# Patient Record
Sex: Male | Born: 1995 | Race: White | Hispanic: No | Marital: Single | Smoking: Former smoker
Health system: Southern US, Community
[De-identification: ages and names within clinical notes are randomized; demographics above are authoritative.]

## PROBLEM LIST (undated history)

## (undated) DIAGNOSIS — M419 Scoliosis, unspecified: Secondary | ICD-10-CM

## (undated) HISTORY — DX: Scoliosis, unspecified: M41.9

---
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
## 2015-02-25 ENCOUNTER — Emergency Department (HOSPITAL_COMMUNITY)
Admission: EM | Admit: 2015-02-25 | Discharge: 2015-02-25 | Disposition: A | Discharge status: Home / Self Care | Payer: Self-pay | Attending: Emergency Medicine | Admitting: Emergency Medicine

## 2015-02-25 ENCOUNTER — Encounter (HOSPITAL_COMMUNITY): Payer: Self-pay | Admitting: *Deleted

## 2015-02-25 ENCOUNTER — Emergency Department (HOSPITAL_COMMUNITY): Payer: Self-pay

## 2015-02-25 DIAGNOSIS — S60032A Contusion of left middle finger without damage to nail, initial encounter: Secondary | ICD-10-CM | POA: Insufficient documentation

## 2015-02-25 DIAGNOSIS — Z720 Tobacco use: Secondary | ICD-10-CM | POA: Insufficient documentation

## 2015-02-25 DIAGNOSIS — Y9289 Other specified places as the place of occurrence of the external cause: Secondary | ICD-10-CM | POA: Insufficient documentation

## 2015-02-25 DIAGNOSIS — Y990 Civilian activity done for income or pay: Secondary | ICD-10-CM | POA: Insufficient documentation

## 2015-02-25 DIAGNOSIS — Z79899 Other long term (current) drug therapy: Secondary | ICD-10-CM | POA: Insufficient documentation

## 2015-02-25 DIAGNOSIS — W208XXA Other cause of strike by thrown, projected or falling object, initial encounter: Secondary | ICD-10-CM | POA: Insufficient documentation

## 2015-02-25 DIAGNOSIS — S63613A Unspecified sprain of left middle finger, initial encounter: Secondary | ICD-10-CM | POA: Insufficient documentation

## 2015-02-25 DIAGNOSIS — Y9389 Activity, other specified: Secondary | ICD-10-CM | POA: Insufficient documentation

## 2015-02-25 DIAGNOSIS — S63619A Unspecified sprain of unspecified finger, initial encounter: Secondary | ICD-10-CM

## 2015-02-25 NOTE — ED Notes (Signed)
Pt reports injuring left middle finger at work on wed, reports a tire fell on his finger.

## 2015-02-25 NOTE — ED Provider Notes (Signed)
CSN: [id number]     Arrival date & time 02/25/15  0911 History  This chart was scribed for Burna FortsJeff Toni Demo, PA-C, working with Bethann BerkshireJoseph Zammit, MD by Elon SpannerGarrett Cook, ED Scribe. This patient was seen in room TR10C/TR10C and the patient's care was started at 9:28 AM.   Chief Complaint  Patient presents with  . Finger Injury   The history is provided by the patient. No language interpreter was used.   HPI Comments: Michael Valencia is a 19 y.o. male who presents to the Emergency Department complaining of a left middle finger injury sustained PTA at work.  The patient reports he dropped the rim of a tire on the tip of the finger.  He has had pain, swelling, and bruising since.  Patient is able to flex and extend the complaint with increased pain.  He has not taken anything for pain. He denies history of previous hand fracture.   He denies current left wrist/elbow pain.       History reviewed. No pertinent past medical history. History reviewed. No pertinent past surgical history. History reviewed. No pertinent family history. History  Substance Use Topics  . Smoking status: Current Every Day Smoker    Types: Cigarettes  . Smokeless tobacco: Not on file  . Alcohol Use: No    Review of Systems  All other systems reviewed and are negative.   Allergies  Review of patient's allergies indicates no known allergies.  Home Medications   Prior to Admission medications   Medication Sig Start Date End Date Taking? Authorizing Provider  acetaminophen (TYLENOL) 500 MG tablet Take 1,000 mg by mouth every 6 (six) hours as needed for mild pain.    Historical Provider, MD  desloratadine-pseudoephedrine (CLARINEX-D 24-HOUR) 5-240 MG per 24 hr tablet Take 1 tablet by mouth daily as needed (for congestion).    Historical Provider, MD  HYDROcodone-acetaminophen (NORCO/VICODIN) 5-325 MG per tablet Take 1 tablet by mouth every 6 (six) hours as needed for moderate pain or severe pain. 08/10/14   Mellody DrownLauren Parker, PA-C   ondansetron (ZOFRAN) 4 MG tablet Take 1 tablet (4 mg total) by mouth every 6 (six) hours. 08/10/14   Mellody DrownLauren Parker, PA-C  Pseudoeph-Doxylamine-DM-APAP (NYQUIL PO) Take 30 mLs by mouth daily as needed (for cough).    Historical Provider, MD   BP 123/71 mmHg  Pulse 56  Temp(Src) 98 F (36.7 C) (Oral)  Resp 16  SpO2 100% Physical Exam  Constitutional: He is oriented to person, place, and time. He appears well-developed and well-nourished. No distress.  HENT:  Head: Normocephalic and atraumatic.  Eyes: Conjunctivae and EOM are normal.  Neck: Neck supple. No tracheal deviation present.  Cardiovascular: Normal rate.   Pulmonary/Chest: Effort normal. No respiratory distress.  Musculoskeletal: Normal range of motion.  Pain in the distal aspect of 3rd digit.  Difficulty clenching fist.  Sensation intact of 3rd digit; pain with flexing of DIP against resistance at that digit; Moderate difficulty flexing PIP at the digit due to pain,  Cap refill < 3 seconds.  No pain with palpation with remainder of hand/wrist.  Neurological: He is alert and oriented to person, place, and time.  Skin: Skin is warm and dry.  Psychiatric: He has a normal mood and affect. His behavior is normal.  Nursing note and vitals reviewed.   ED Course  Procedures (including critical care time)  DIAGNOSTIC STUDIES: Oxygen Saturation is 99% on RA, normal by my interpretation.    COORDINATION OF CARE:  9:34 AM Discussed  treatment plan with patient at bedside.  Patient acknowledges and agrees with plan.    Labs Review Labs Reviewed - No data to display  Imaging Review Dg Finger Middle Left  02/25/2015   CLINICAL DATA:  Tire fell on left middle finger 2 days ago, distal phalanx pain  EXAM: LEFT MIDDLE FINGER 2+V  COMPARISON:  None.  FINDINGS: Three views of the left third finger submitted. No acute fracture or subluxation. No radiopaque foreign body.  IMPRESSION: Negative.   Electronically Signed   By: Natasha Mead M.D.    On: 02/25/2015 09:42     EKG Interpretation None      MDM   Final diagnoses:  Sprain, finger, initial encounter    Labs:   Imaging: DG Finger Middle Left- no fx  Therapeutics:  Assessment/Plan: Patient presents with finger pain. No signs of fracture on exam, intact resisted flexion of the DIP and PIP unlikely to be tendon injury. Patient will be placed in a splint, encouraged to use ibuprofen or Tylenol as needed for pain. Follow-up in one week with primary care provider if symptoms continue to persist. Avoid heavy lifting. She'll return precautions. Patient verbalized understanding and agreement.  Discharge Meds:     Eyvonne Mechanic, PA-C 02/25/15 1450  Bethann Berkshire, MD 02/26/15 [phone]

## 2015-02-25 NOTE — Discharge Instructions (Signed)
Finger Sprain A finger sprain happens when the bands of tissue that hold the finger bones together (ligaments) stretch too much and tear. HOME CARE  Keep your injured finger raised (elevated) when possible.  Put ice on the injured area, twice a day, for 2 to 3 days.  Put ice in a plastic bag.  Place a towel between your skin and the bag.  Leave the ice on for 15 minutes.  Only take medicine as told by your doctor.  Do not wear rings on the injured finger.  Protect your finger until pain and stiffness go away (usually 3 to 4 weeks).  Do not get your cast or splint to get wet. Cover your cast or splint with a plastic bag when you shower or bathe. Do not swim.  Your doctor may suggest special exercises for you to do. These exercises will help keep or stop stiffness from happening. GET HELP RIGHT AWAY IF:  Your cast or splint gets damaged.  Your pain gets worse, not better. MAKE SURE YOU:  Understand these instructions.  Will watch your condition.  Will get help right away if you are not doing well or get worse. Document Released: 09/08/2010 Document Revised: 10/29/2011 Document Reviewed: 04/09/2011 [facility] Patient Information 2015 [employer] [place]. This information is not intended to replace advice given to you by your health care provider. Make sure you discuss any questions you have with your health care provider.  Please read attached information, please follow-up with hand specialist if symptoms continue to persist.

## 2015-05-10 ENCOUNTER — Emergency Department (HOSPITAL_COMMUNITY): Payer: Self-pay

## 2015-05-10 ENCOUNTER — Emergency Department (HOSPITAL_COMMUNITY)
Admission: EM | Admit: 2015-05-10 | Discharge: 2015-05-10 | Disposition: A | Discharge status: Home / Self Care | Payer: Self-pay | Attending: Emergency Medicine | Admitting: Emergency Medicine

## 2015-05-10 ENCOUNTER — Encounter (HOSPITAL_COMMUNITY): Payer: Self-pay | Admitting: Emergency Medicine

## 2015-05-10 DIAGNOSIS — R1031 Right lower quadrant pain: Secondary | ICD-10-CM | POA: Insufficient documentation

## 2015-05-10 DIAGNOSIS — R1011 Right upper quadrant pain: Secondary | ICD-10-CM | POA: Insufficient documentation

## 2015-05-10 DIAGNOSIS — R112 Nausea with vomiting, unspecified: Secondary | ICD-10-CM | POA: Insufficient documentation

## 2015-05-10 DIAGNOSIS — Z87891 Personal history of nicotine dependence: Secondary | ICD-10-CM | POA: Insufficient documentation

## 2015-05-10 LAB — COMPREHENSIVE METABOLIC PANEL
ALBUMIN: 4.1 g/dL (ref 3.5–5.0)
ALK PHOS: 47 U/L (ref 38–126)
ALT: 14 U/L — ABNORMAL LOW (ref 17–63)
AST: 16 U/L (ref 15–41)
Anion gap: 6 (ref 5–15)
BILIRUBIN TOTAL: 0.7 mg/dL (ref 0.3–1.2)
BUN: 7 mg/dL (ref 6–20)
CO2: 26 mmol/L (ref 22–32)
Calcium: 9.1 mg/dL (ref 8.9–10.3)
Chloride: 104 mmol/L (ref 101–111)
Creatinine, Ser: 0.74 mg/dL (ref 0.61–1.24)
GFR calc Af Amer: >60 mL/min (ref >60)
GFR calc non Af Amer: >60 mL/min (ref >60)
GLUCOSE: 93 mg/dL (ref 65–99)
POTASSIUM: 4.2 mmol/L (ref 3.5–5.1)
Sodium: 136 mmol/L (ref 135–145)
TOTAL PROTEIN: 6.6 g/dL (ref 6.5–8.1)

## 2015-05-10 LAB — CBC WITH DIFFERENTIAL/PLATELET
BASOS ABS: 0 10*3/uL (ref 0.0–0.1)
BASOS PCT: 0 %
Eosinophils Absolute: 0 10*3/uL (ref 0.0–0.7)
Eosinophils Relative: 0 %
HEMATOCRIT: 43.2 % (ref 39.0–52.0)
HEMOGLOBIN: 15 g/dL (ref 13.0–17.0)
Lymphocytes Relative: 44 %
Lymphs Abs: 2.1 10*3/uL (ref 0.7–4.0)
MCH: 30.4 pg (ref 26.0–34.0)
MCHC: 34.7 g/dL (ref 30.0–36.0)
MCV: 87.4 fL (ref 78.0–100.0)
Monocytes Absolute: 0.3 10*3/uL (ref 0.1–1.0)
Monocytes Relative: 6 %
NEUTROS ABS: 2.4 10*3/uL (ref 1.7–7.7)
NEUTROS PCT: 50 %
Platelets: 121 10*3/uL — ABNORMAL LOW (ref 150–400)
RBC: 4.94 MIL/uL (ref 4.22–5.81)
RDW: 12 % (ref 11.5–15.5)
WBC: 4.7 10*3/uL (ref 4.0–10.5)

## 2015-05-10 LAB — URINALYSIS, ROUTINE W REFLEX MICROSCOPIC
BILIRUBIN URINE: NEGATIVE
Glucose, UA: NEGATIVE mg/dL
Hgb urine dipstick: NEGATIVE
Ketones, ur: NEGATIVE mg/dL
Leukocytes, UA: NEGATIVE
NITRITE: NEGATIVE
PH: 6.5 (ref 5.0–8.0)
Protein, ur: NEGATIVE mg/dL
SPECIFIC GRAVITY, URINE: 1.027 (ref 1.005–1.030)
Urobilinogen, UA: 0.2 mg/dL (ref 0.0–1.0)

## 2015-05-10 LAB — LIPASE, BLOOD: Lipase: 21 U/L — ABNORMAL LOW (ref 22–51)

## 2015-05-10 MED ORDER — KETOROLAC TROMETHAMINE 30 MG/ML IJ SOLN
30.0000 mg | Freq: Once | INTRAMUSCULAR | Status: AC
  Administered 2015-05-10: 30 mg via INTRAVENOUS
  Filled 2015-05-10: qty 1

## 2015-05-10 MED ORDER — IOHEXOL 300 MG/ML  SOLN
25.0000 mL | Freq: Once | INTRAMUSCULAR | Status: AC | PRN
  Administered 2015-05-10: 25 mL via ORAL

## 2015-05-10 MED ORDER — SODIUM CHLORIDE 0.9 % IV BOLUS (SEPSIS)
1000.0000 mL | INTRAVENOUS | Status: AC
  Administered 2015-05-10: 1000 mL via INTRAVENOUS

## 2015-05-10 MED ORDER — IOHEXOL 300 MG/ML  SOLN
100.0000 mL | Freq: Once | INTRAMUSCULAR | Status: AC | PRN
  Administered 2015-05-10: 100 mL via INTRAVENOUS

## 2015-05-10 MED ORDER — ONDANSETRON HCL 4 MG/2ML IJ SOLN
4.0000 mg | Freq: Once | INTRAMUSCULAR | Status: AC
Start: 2015-05-10 — End: 2015-05-10
  Administered 2015-05-10: 4 mg via INTRAVENOUS
  Filled 2015-05-10: qty 2

## 2015-05-10 MED ORDER — ONDANSETRON HCL 4 MG PO TABS: 4.0000 mg | ORAL_TABLET | Freq: Three times a day (TID) | ORAL | Status: DC | PRN

## 2015-05-10 NOTE — ED Provider Notes (Signed)
CSN: [id number]     Arrival date & time 05/10/15  0902 History   First MD Initiated Contact with Patient 05/10/15 [phone]     Chief Complaint  Patient presents with  . Abdominal Pain     (Consider location/radiation/quality/duration/timing/severity/associated sxs/prior Treatment) HPI   Patient is a 19 year old male, otherwise healthy, who presents to emergency department with intermittent right sided abdominal pain that began yesterday with some preceding nausea. His first episode of pain began approximately noon yesterday while he was sitting on the couch. He states he did recently eaten, he had sharp stabbing right-sided right upper quadrant pain that radiated to his back which lasted approximately one hour and was associated with vomiting. His pain improved with lying flat on his back and was worsened by turning onto his right side. The pain subsided spontaneously without any intervention and he describes the remaining pain as a squeezing and a pressure rated 5 out of 10 that is constant and his right side.  He ate dinner at approximately 8:30 but did feel nauseous. He had another episode of severe pain at approximately 11:00 last night which lasted 10 minutes and he had some more vomiting and he was able to go to bed. At approximately 4:30 this morning the pain woke him up from his sleep where he had recurrence of his pain similar to his other episodes that lasted for 35 minutes and then it went away and he is able to go back to sleep. His father woke him up at 6:30 and he again had another episode of more severe pain which also lasted about 35 minutes, when it subsided and he still had the squeezing and pressure with nausea that he was able to go to work. He began discussing his symptoms with his coworkers and they told him he should go to the emergency department to be evaluated since a recent coworker had had acute appendicitis.  The patient states that he currently has the dull pain located on his  right side and he is nauseated and has no appetite. He states that yesterday he had some squeezing pain in his right back but it did not last very long and didn't think much of it. Overall he feels like he just had a stomach ache with nausea and he thought it would pass. He has had some hot and cold flashes with some chills but denies having any fever. He denies any back pain currently. He denies any change in his urine color. He does not any dysuria or urinary frequency, and he states that he drinks a lot of water and does have normal micturition with large amount of urine. He denies any alcohol use denies NSAID use. He does not feel that his abdomen is distended, he denies dyspepsia denies reflux symptoms.     History reviewed. No pertinent past medical history. No past surgical history on file. No family history on file. Social History  Substance Use Topics  . Smoking status: Former Smoker    Types: Cigarettes  . Smokeless tobacco: None  . Alcohol Use: No    Review of Systems 10 Systems reviewed and are negative for acute change except as noted in the HPI.    Allergies  Review of patient's allergies indicates no known allergies.  Home Medications   Prior to Admission medications   Medication Sig Start Date End Date Taking? Authorizing Provider  acetaminophen (TYLENOL) 500 MG tablet Take 1,000 mg by mouth every 6 (six) hours as needed for mild  pain.    Historical Provider, MD  desloratadine-pseudoephedrine (CLARINEX-D 24-HOUR) 5-240 MG per 24 hr tablet Take 1 tablet by mouth daily as needed (for congestion).    Historical Provider, MD  HYDROcodone-acetaminophen (NORCO/VICODIN) 5-325 MG per tablet Take 1 tablet by mouth every 6 (six) hours as needed for moderate pain or severe pain. Patient not taking: Reported on 05/10/2015 08/10/14   Mellody Drown, PA-C  ondansetron (ZOFRAN) 4 MG tablet Take 1 tablet (4 mg total) by mouth every 8 (eight) hours as needed for nausea or vomiting. 05/10/15    Danelle Berry, PA-C  Pseudoeph-Doxylamine-DM-APAP (NYQUIL PO) Take 30 mLs by mouth daily as needed (for cough).    Historical Provider, MD   BP 130/62 mmHg  Pulse 51  Temp(Src) 97.5 F (36.4 C) (Oral)  Resp 16  SpO2 100% Physical Exam  Constitutional: He is oriented to person, place, and time. He appears well-developed and well-nourished. No distress.  HENT:  Head: Normocephalic and atraumatic.  Nose: Nose normal.  Mouth/Throat: Oropharynx is clear and moist. No oropharyngeal exudate.  Eyes: Conjunctivae and EOM are normal. Pupils are equal, round, and reactive to light. Right eye exhibits no discharge. Left eye exhibits no discharge. No scleral icterus.  Neck: Normal range of motion. No JVD present. No tracheal deviation present. No thyromegaly present.  Cardiovascular: Normal rate, regular rhythm, normal heart sounds and intact distal pulses.  Exam reveals no gallop and no friction rub.   No murmur heard. Pulmonary/Chest: Effort normal and breath sounds normal. No respiratory distress. He has no wheezes. He has no rales. He exhibits no tenderness.  Abdominal: Soft. Normal appearance and bowel sounds are normal. He exhibits no distension and no mass. There is no hepatosplenomegaly. There is tenderness in the right lower quadrant. There is guarding and tenderness at McBurney's point. There is no rigidity, no rebound, no CVA tenderness and negative Murphy's sign. No hernia.  Abdomen normal appearing, soft, non-distended, normal bowel sounds throughout, patient is tender to palpation in the right lower quadrant and right side with guarding. Negative Rovsing and negative obturator, no rebound tenderness, negative Murphy's, No CVA tenderness, Positive heel-tap  Musculoskeletal: Normal range of motion. He exhibits no edema or tenderness.  Lymphadenopathy:    He has no cervical adenopathy.  Neurological: He is alert and oriented to person, place, and time. He has normal reflexes. No cranial nerve  deficit. He exhibits normal muscle tone. Coordination normal.  Skin: Skin is warm and dry. No rash noted. He is not diaphoretic. No erythema. No pallor.  Psychiatric: He has a normal mood and affect. His behavior is normal. Judgment and thought content normal.  Nursing note and vitals reviewed.   ED Course  Procedures (including critical care time) Labs Review Labs Reviewed  CBC WITH DIFFERENTIAL/PLATELET - Abnormal; Notable for the following:    Platelets 121 (*)    All other components within normal limits  COMPREHENSIVE METABOLIC PANEL - Abnormal; Notable for the following:    ALT 14 (*)    All other components within normal limits  LIPASE, BLOOD - Abnormal; Notable for the following:    Lipase 21 (*)    All other components within normal limits  URINALYSIS, ROUTINE W REFLEX MICROSCOPIC (NOT AT [facility])    Imaging Review Ct Abdomen Pelvis W Contrast  05/10/2015   CLINICAL DATA:  Right upper quadrant abdominal pain since yesterday. Vomiting and pain after eating. Diarrhea.  EXAM: CT ABDOMEN AND PELVIS WITH CONTRAST  TECHNIQUE: Multidetector CT imaging of the  abdomen and pelvis was performed using the standard protocol following bolus administration of intravenous contrast.  CONTRAST:  OMNIPAQUE IOHEXOL 300 MG/ML  SOLN  COMPARISON:  None.  FINDINGS: Lower chest:  No acute findings.  Hepatobiliary: No masses or other significant abnormality.  Pancreas: No mass, inflammatory changes, or other significant abnormality.  Spleen: Within normal limits in size and appearance.  Adrenals/Urinary Tract: No masses identified. No evidence of hydronephrosis.  Stomach/Bowel: No evidence of obstruction, inflammatory process, or abnormal fluid collections. Appendix is visualized and is normal.  Vascular/Lymphatic: No pathologically enlarged lymph nodes. No evidence of abdominal aortic aneursym.  Reproductive: No mass or other significant abnormality.  Other: None.  Musculoskeletal:  No suspicious bone  lesions identified.  IMPRESSION: Negative study.   Electronically Signed   By: Charlett Nose M.D.   On: 05/10/2015 13:05   I have personally reviewed and evaluated these images and lab results as part of my medical decision-making.   EKG Interpretation None      MDM   Final diagnoses:  Right upper quadrant pain  Non-intractable vomiting with nausea, vomiting of unspecified type    Patient with reported waxing and waning right upper quadrant abdominal pain. Did complain of one episode of squeezing right flank pain. Also complaining of nausea and vomiting. His history was most concerning for nephrolithiasis versus cholecystitis however his exam was more pertinent for right lower quadrant tenderness with guarding. And there is no right upper quadrant tenderness negative Murphy's.   He is well-appearing, he is afebrile and has remaining normal vital signs. He does not appear toxic or ill appearing.  CMP, CBC, lipase, UA, fluids, zofran and toradol. Because of his tenderness on exam do CT abdomen and pelvis to first rule out acute cholecystitis, this is somewhat suspicious with his exam, his nausea and his loss of appetite. It may be an early presentation, as he does not have any rebound tenderness and no referred pain with Rovsing, however heel tap did elicit his pain and he does state that his abdomen does hurt with walking.   Acute cholecystitis and nephrolithiasis are still in the differential, however I believe that LFTs and UA will be helpful for evaluating and guiding any further testing should be warranted at that time.  He last ate last night at 8:30 PM. He has no known allergies, does not take any medication  CT abdomen was negative, pt was discharged with zofran, supportive care.  Vitals were stable and his pain was controlled.  He received a dose of zofran and did not have any active vomiting.  Filed Vitals:   05/10/15 1200 05/10/15 1300 05/10/15 1323 05/10/15 1330  BP: 121/68  127/62 130/62   Pulse: 61 52 51   Temp:    97.5 F (36.4 C)  TempSrc:      Resp: 16     SpO2: 100% 100% 100%    Medications  sodium chloride 0.9 % bolus 1,000 mL (0 mLs Intravenous Stopped 05/10/15 1256)  ondansetron (ZOFRAN) injection 4 mg (4 mg Intravenous Given 05/10/15 1145)  ketorolac (TORADOL) 30 MG/ML injection 30 mg (30 mg Intravenous Given 05/10/15 1145)  iohexol (OMNIPAQUE) 300 MG/ML solution 25 mL (25 mLs Oral Contrast Given 05/10/15 1050)  iohexol (OMNIPAQUE) 300 MG/ML solution 100 mL (100 mLs Intravenous Contrast Given 05/10/15 1232)       Danelle Berry, PA-C 05/16/15 1804  Derwood Kaplan, MD 05/23/15 0111

## 2015-05-10 NOTE — Discharge Instructions (Signed)

## 2015-05-10 NOTE — ED Notes (Signed)
PT reports URQ belly pain since yesterday; vomiting and pain after eating. Diarr. 4 episodes of vomiting. Has gallbladder and appe

## 2015-08-06 ENCOUNTER — Emergency Department (HOSPITAL_COMMUNITY)
Admission: EM | Admit: 2015-08-06 | Discharge: 2015-08-06 | Disposition: A | Discharge status: Home / Self Care | Payer: Self-pay | Attending: Emergency Medicine | Admitting: Emergency Medicine

## 2015-08-06 ENCOUNTER — Encounter (HOSPITAL_COMMUNITY): Payer: Self-pay | Admitting: Emergency Medicine

## 2015-08-06 DIAGNOSIS — Z87891 Personal history of nicotine dependence: Secondary | ICD-10-CM | POA: Insufficient documentation

## 2015-08-06 DIAGNOSIS — R112 Nausea with vomiting, unspecified: Secondary | ICD-10-CM | POA: Insufficient documentation

## 2015-08-06 DIAGNOSIS — R197 Diarrhea, unspecified: Secondary | ICD-10-CM

## 2015-08-06 DIAGNOSIS — E860 Dehydration: Secondary | ICD-10-CM | POA: Insufficient documentation

## 2015-08-06 MED ORDER — SODIUM CHLORIDE 0.9 % IV BOLUS (SEPSIS)
1000.0000 mL | Freq: Once | INTRAVENOUS | Status: AC
  Administered 2015-08-06: 1000 mL via INTRAVENOUS

## 2015-08-06 MED ORDER — ONDANSETRON HCL 4 MG/2ML IJ SOLN
4.0000 mg | Freq: Once | INTRAMUSCULAR | Status: AC
  Administered 2015-08-06: 4 mg via INTRAVENOUS
  Filled 2015-08-06: qty 2

## 2015-08-06 MED ORDER — METOCLOPRAMIDE HCL 5 MG/ML IJ SOLN
10.0000 mg | Freq: Once | INTRAMUSCULAR | Status: AC
  Administered 2015-08-06: 10 mg via INTRAVENOUS
  Filled 2015-08-06: qty 2

## 2015-08-06 MED ORDER — PROMETHAZINE HCL 25 MG PO TABS: 25.0000 mg | ORAL_TABLET | Freq: Four times a day (QID) | ORAL | Status: DC | PRN

## 2015-08-06 NOTE — ED Provider Notes (Signed)
CSN: [id number]     Arrival date & time 08/06/15  [id number] History   First MD Initiated Contact with Patient 08/06/15 [phone]     Chief Complaint  Patient presents with  . Emesis     HPI Patient presents with nausea vomiting and diarrhea over the past 3 days with mild decreased oral intake.  No blood in his vomit or diarrhea described.  Patient denies significant lightheadedness.  No significant past medical history for the patient.  No recent sick contacts.  Symptoms are moderate in severity.   History reviewed. No pertinent past medical history. History reviewed. No pertinent past surgical history. No family history on file. Social History  Substance Use Topics  . Smoking status: Former Smoker    Types: Cigarettes  . Smokeless tobacco: None  . Alcohol Use: No    Review of Systems  All other systems reviewed and are negative.     Allergies  Review of patient's allergies indicates no known allergies.  Home Medications   Prior to Admission medications   Medication Sig Start Date End Date Taking? Authorizing Provider  promethazine (PHENERGAN) 25 MG tablet Take 1 tablet (25 mg total) by mouth every 6 (six) hours as needed for nausea or vomiting. 08/06/15   Azalia BilisKevin Geovany Trudo, MD   BP 131/80 mmHg  Pulse 87  Temp(Src) 97.9 F (36.6 C) (Oral)  Resp 15  Ht 6\' 1"  (1.854 m)  Wt 186 lb (84.369 kg)  BMI 24.55 kg/m2  SpO2 98% Physical Exam  Constitutional: He is oriented to person, place, and time. He appears well-developed and well-nourished.  HENT:  Head: Normocephalic and atraumatic.  Eyes: EOM are normal.  Neck: Normal range of motion.  Cardiovascular: Normal rate, regular rhythm, normal heart sounds and intact distal pulses.   Pulmonary/Chest: Effort normal and breath sounds normal. No respiratory distress.  Abdominal: Soft. He exhibits no distension. There is no tenderness.  Musculoskeletal: Normal range of motion.  Neurological: He is alert and oriented to person, place, and  time.  Skin: Skin is warm and dry.  Psychiatric: He has a normal mood and affect. Judgment normal.  Nursing note and vitals reviewed.   ED Course  Procedures (including critical care time) Labs Review Labs Reviewed - No data to display  Imaging Review No results found. I have personally reviewed and evaluated these images and lab results as part of my medical decision-making.   EKG Interpretation None      MDM   Final diagnoses:  Nausea vomiting and diarrhea  Dehydration    9:05 AM Patient feels much better after IV fluids and antinausea medicine in emergency department.  Discharge home in good condition.  He understands return to the ER for new or worsening symptoms.  Likely viral GI illness    Azalia BilisKevin Retia Cordle, MD 08/06/15 [phone]

## 2015-08-06 NOTE — ED Notes (Signed)
Per pt, he started feeling bad about 2 days ago. This am on the way he experienced 3 bouts of vomiting and "some diarrhea" as well. Denies abdominal pain

## 2015-10-24 ENCOUNTER — Emergency Department (HOSPITAL_COMMUNITY)
Admission: EM | Admit: 2015-10-24 | Discharge: 2015-10-24 | Disposition: A | Discharge status: Home / Self Care | Payer: BLUE CROSS/BLUE SHIELD | Attending: Physician Assistant | Admitting: Physician Assistant

## 2015-10-24 ENCOUNTER — Encounter (HOSPITAL_COMMUNITY): Payer: Self-pay | Admitting: *Deleted

## 2015-10-24 ENCOUNTER — Emergency Department (HOSPITAL_COMMUNITY): Payer: BLUE CROSS/BLUE SHIELD

## 2015-10-24 DIAGNOSIS — Z87891 Personal history of nicotine dependence: Secondary | ICD-10-CM | POA: Insufficient documentation

## 2015-10-24 DIAGNOSIS — S60211A Contusion of right wrist, initial encounter: Secondary | ICD-10-CM | POA: Insufficient documentation

## 2015-10-24 DIAGNOSIS — Y9389 Activity, other specified: Secondary | ICD-10-CM | POA: Insufficient documentation

## 2015-10-24 DIAGNOSIS — Y9289 Other specified places as the place of occurrence of the external cause: Secondary | ICD-10-CM | POA: Insufficient documentation

## 2015-10-24 DIAGNOSIS — Y998 Other external cause status: Secondary | ICD-10-CM | POA: Insufficient documentation

## 2015-10-24 DIAGNOSIS — W228XXA Striking against or struck by other objects, initial encounter: Secondary | ICD-10-CM | POA: Insufficient documentation

## 2015-10-24 MED ORDER — HYDROCODONE-ACETAMINOPHEN 5-325 MG PO TABS
1.0000 | ORAL_TABLET | Freq: Once | ORAL | Status: AC
  Administered 2015-10-24: 1 via ORAL
  Filled 2015-10-24: qty 1

## 2015-10-24 MED ORDER — NAPROXEN 500 MG PO TABS: 500.0000 mg | ORAL_TABLET | Freq: Two times a day (BID) | ORAL | Status: DC

## 2015-10-24 NOTE — ED Provider Notes (Signed)
CSN: [id number]     Arrival date & time 10/24/15  1746 History  By signing my name below, I, Michael Valencia, attest that this documentation has been prepared under the direction and in the presence of non-physician practitioner, Noelle Penner, PA-C. Electronically Signed: Linna Valencia, Scribe. 10/24/2015. 6:41 PM.    Chief Complaint  Patient presents with  . Wrist Pain    The history is provided by the patient. No language interpreter was used.     HPI Comments: Michael Valencia is a 20 y.o. male with no pertinent PMHx who presents to the Emergency Department complaining of constant, worsening, severe, 7/10 right wrist pain onset 3 days ago s/p hitting his right arm on a piece of metal. Pt was taking the oil filter off of his car when incident occurred; he states that his right hand went limp and he heard his right wrist pop. States he used his left hand to pop his right hand back into place. He notes that his pain did not start until the following day and he decided to come to the ER today because his pain has become unbearable. He notes that he has pain radiating from his right wrist throughout his right hand. Pt states that he experienced numbness in his right hand a couple of days ago but it has since subsided. He reports that he is able to wiggle his right fingers and make a fist with difficulty due to pain. Pt has taken advil with mild relief; his last dose was around 4PM this afternoon. Pt denies weakness or any other associated symptoms.   No past medical history on file. No past surgical history on file. No family history on file. Social History  Substance Use Topics  . Smoking status: Former Smoker    Types: Cigarettes  . Smokeless tobacco: Not on file  . Alcohol Use: No    Review of Systems  Musculoskeletal: Positive for arthralgias.  Skin: Positive for wound.  Neurological: Negative for weakness and numbness.  All other systems reviewed and are negative.     Allergies   Review of patient's allergies indicates no known allergies.  Home Medications   Prior to Admission medications   Medication Sig Start Date End Date Taking? Authorizing Provider  promethazine (PHENERGAN) 25 MG tablet Take 1 tablet (25 mg total) by mouth every 6 (six) hours as needed for nausea or vomiting. 08/06/15   Azalia Bilis, MD   There were no vitals taken for this visit. Physical Exam  Constitutional: He is oriented to person, place, and time. He appears well-developed and well-nourished. No distress.  HENT:  Head: Normocephalic and atraumatic.  Eyes: Conjunctivae and EOM are normal.  Neck: Neck supple. No tracheal deviation present.  Cardiovascular: Normal rate.   Pulmonary/Chest: Effort normal. No respiratory distress.  Musculoskeletal: Normal range of motion.  Normal finger opposition FROM all fingers Right hand and wrist diffusely TTP. No erythema or edema.   Neurological: He is alert and oriented to person, place, and time.  Skin: Skin is warm and dry.  Psychiatric: He has a normal mood and affect. His behavior is normal.  Nursing note and vitals reviewed.   ED Course  Procedures (including critical care time)  DIAGNOSTIC STUDIES: Oxygen Saturation is 100% on RA, normal by my interpretation.    COORDINATION OF CARE: 6:41 PM Will administer hydrocodone 1 tablet. Will order DG Hand Complete Right and DG Wrist Complete Right. Discussed treatment plan with pt at bedside and pt agreed to  plan.   Labs Review Labs Reviewed - No data to display  Imaging Review Dg Wrist Complete Right  10/24/2015  CLINICAL DATA:  Patient with injury to the wrist while changing the oil. Wrist pain. Initial encounter. EXAM: RIGHT WRIST - COMPLETE 3+ VIEW COMPARISON:  Right and radiograph 07/29/2010 FINDINGS: There is no evidence of fracture or dislocation. There is no evidence of arthropathy or other focal bone abnormality. Soft tissues are unremarkable. IMPRESSION: Negative. Electronically  Signed   By: Annia Beltrew  Davis M.D.   On: 10/24/2015 19:27   Dg Hand Complete Right  10/24/2015  CLINICAL DATA:  Posterior hand and wrist pain after injury today. EXAM: RIGHT HAND - COMPLETE 3+ VIEW COMPARISON:  07/29/2010 radiographs. FINDINGS: The mineralization and alignment are normal. There is no evidence of acute fracture or dislocation. The joint spaces are maintained. No focal soft tissue abnormalities identified. IMPRESSION: No acute osseous findings seen within the right hand. Electronically Signed   By: Carey BullocksWilliam  Veazey M.D.   On: 10/24/2015 19:28   I have personally reviewed and evaluated these images as part of my medical decision-making.   EKG Interpretation None      MDM   Final diagnoses:  Contusion of right wrist, initial encounter    X-rays unremarkable for acute pathology. Pt neurovascularly intact. Suspect contusion. Wrist splint given for compression. Norco given in the ED with rx for naproxen. Encouraged RICE therapy. Resource guide given to establish PCP for f/u. ER return precautions given.   I personally performed the services described in this documentation, which was scribed in my presence. The recorded information has been reviewed and is accurate.     Carlene CoriaSerena Y Travonta Gill, PA-C 10/24/15 1957  Courteney Randall AnLyn Mackuen, MD 10/25/15 1630

## 2015-10-24 NOTE — Discharge Instructions (Signed)
You were seen in the ER today for evaluation of your right wrist. Your x-rays were normal with no evidence of fracture or dislocation. We gave you a brace to use for compression. Rest your wrist/hand, ice on and off as needed for pain. I will give you a prescription for naproxen as well.  Take medications as prescribed. Return to the emergency room for worsening condition or new concerning symptoms. Follow up with your regular doctor. If you don't have a regular doctor use one of the numbers below to establish a primary care doctor.   Emergency Department Resource Guide 1) Find a Doctor and Pay Out of Pocket Although you won't have to find out who is covered by your insurance plan, it is a good idea to ask around and get recommendations. You will then need to call the office and see if the doctor you have chosen will accept you as a new patient and what types of options they offer for patients who are self-pay. Some doctors offer discounts or will set up payment plans for their patients who do not have insurance, but you will need to ask so you aren't surprised when you get to your appointment.  2) Contact Your Local Health Department Not all health departments have doctors that can see patients for sick visits, but many do, so it is worth a call to see if yours does. If you don't know where your local health department is, you can check in your phone book. The CDC also has a tool to help you locate your state's health department, and many state websites also have listings of all of their local health departments.  3) Find a Walk-in Clinic If your illness is not likely to be very severe or complicated, you may want to try a walk in clinic. These are popping up all over the country in pharmacies, drugstores, and shopping centers. They're usually staffed by nurse practitioners or physician assistants that have been trained to treat common illnesses and complaints. They're usually fairly quick and  inexpensive. However, if you have serious medical issues or chronic medical problems, these are probably not your best option.  No Primary Care Doctor: - Call Health Connect at  [phone] - they can help you locate a primary care doctor that  accepts your insurance, provides certain services, etc. - Physician Referral [phone]  Emergency Department Resource Guide 1) Find a Doctor and Pay Out of Pocket Although you won't have to find out who is covered by your insurance plan, it is a good idea to ask around and get recommendations. You will then need to call the office and see if the doctor you have chosen will accept you as a new patient and what types of options they offer for patients who are self-pay. Some doctors offer discounts or will set up payment plans for their patients who do not have insurance, but you will need to ask so you aren't surprised when you get to your appointment.  2) Contact Your Local Health Department Not all health departments have doctors that can see patients for sick visits, but many do, so it is worth a call to see if yours does. If you don't know where your local health department is, you can check in your phone book. The CDC also has a tool to help you locate your state's health department, and many state websites also have listings of all of their local health departments.  3) Find a [employer] If your  illness is not likely to be very severe or complicated, you may want to try a walk in clinic. These are popping up all over the country in pharmacies, drugstores, and shopping centers. They're usually staffed by nurse practitioners or physician assistants that have been trained to treat common illnesses and complaints. They're usually fairly quick and inexpensive. However, if you have serious medical issues or chronic medical problems, these are probably not your best option.  No Primary Care Doctor: - Call Health Connect at  [phone] - they can  help you locate a primary care doctor that  accepts your insurance, provides certain services, etc. - Physician Referral Service- [phone]  Chronic Pain Problems: Organization         Address  Phone   Notes  [facility]  [phone]) [phone] Patients need to be referred by their primary care doctor.   Medication Assistance: Organization         Address  Phone   Notes  [facility] [address] [place], [place] [address] [phone] --Must be a resident of [place] -- Must have NO insurance coverage whatsoever (no Medicaid/ Medicare, etc.) -- The pt. MUST have a primary care doctor that directs their care regularly and follows them in the community   [facility]ist  [phone]   [employer]  [phone]) [phone]    Agencies that provide inexpensive medical care: Organization         Address  Phone   Notes  [facility]  [phone]   [facility]    [phone]) [phone]   [facility] [address] [place], [place] [address] [phone]) [phone]   [facility] [address], [place] [phone]) [phone]   [facility]    [phone]   [facility]    [phone]) [phone]   [facility] and [facility]  [address], [place] Phone:  [phone], Fax:  [phone] Hours of Operation:  9 am - 6 pm, M-F.  Also accepts Medicaid/Medicare and self-pay.  [facility]  [address], [place] Phone: [phone], Fax: [phone]. Hours of Operation:  8:30 am - 5:30 pm, M-F.  Also accepts Medicaid and self-pay.  [facility] [place] [address], [place] Phone: [phone]) [phone]   [facility] [address], [place], [place] [phone], Ext. [phone] Mondays & Thursdays: 7-9 AM.  First 15 patients are seen on a first come, first serve basis.    Medicaid-accepting [facility] Providers:  Organization         Address  Phone   Notes  [facility] [address], [place] [phone]) [phone] Also accepts self-pay patients.  [facility] [address], [place]  [phone]   [facility] [address], [place] [phone]   [facility] [address] [place] [address], [place] [phone]   Renaye RakersVeita Bland [address], [place]   [phone] Only accepts [place] patients after they have their name applied to their card.   Self-Pay (no insurance) in [facility] [place]:  Organization         Address  Phone   Notes  Sickle Cell Patients, [facility] [address], [place] [phone]) [phone]  [facility] [place] [phone]   [facility] [address] [place]  [address], [place] [phone]   [facility]/Dr. Osei-Bonsu  [address], [place] or [address], [place] [phone] Phone number for both [place] and [place] locations is the same.  [facility] [address], [place] [phone]   [facility] [address] [place] or [address] [phone]   [facility] [address] [place] [phone], phone; [phone], fax Sees patients 1st and 3rd Saturday of every month.  Must not qualify for public or private insurance (i.e. Medicaid, Medicare, [place] Health Choice, Veterans' Benefits)  Household income should be no more than 200% of the poverty level The clinic cannot treat you if you are pregnant or think you are pregnant  Sexually transmitted diseases are not treated at the clinic.

## 2015-10-24 NOTE — ED Notes (Signed)
PT reports he injured his RT wrist on Friday while changing the oil in his car.

## 2015-10-24 NOTE — ED Notes (Signed)
Patient transported to X-ray 

## 2016-05-30 ENCOUNTER — Emergency Department (HOSPITAL_COMMUNITY)
Admission: EM | Admit: 2016-05-30 | Discharge: 2016-05-30 | Disposition: A | Discharge status: Home / Self Care | Payer: BLUE CROSS/BLUE SHIELD | Attending: Emergency Medicine | Admitting: Emergency Medicine

## 2016-05-30 ENCOUNTER — Encounter (HOSPITAL_COMMUNITY): Payer: Self-pay | Admitting: *Deleted

## 2016-05-30 ENCOUNTER — Emergency Department (HOSPITAL_COMMUNITY): Payer: BLUE CROSS/BLUE SHIELD

## 2016-05-30 DIAGNOSIS — Z87891 Personal history of nicotine dependence: Secondary | ICD-10-CM | POA: Insufficient documentation

## 2016-05-30 DIAGNOSIS — Y939 Activity, unspecified: Secondary | ICD-10-CM | POA: Diagnosis not present

## 2016-05-30 DIAGNOSIS — Y929 Unspecified place or not applicable: Secondary | ICD-10-CM | POA: Insufficient documentation

## 2016-05-30 DIAGNOSIS — W208XXA Other cause of strike by thrown, projected or falling object, initial encounter: Secondary | ICD-10-CM | POA: Insufficient documentation

## 2016-05-30 DIAGNOSIS — S9030XA Contusion of unspecified foot, initial encounter: Secondary | ICD-10-CM

## 2016-05-30 DIAGNOSIS — S99921A Unspecified injury of right foot, initial encounter: Secondary | ICD-10-CM | POA: Diagnosis present

## 2016-05-30 DIAGNOSIS — S9031XA Contusion of right foot, initial encounter: Secondary | ICD-10-CM | POA: Diagnosis not present

## 2016-05-30 DIAGNOSIS — Y999 Unspecified external cause status: Secondary | ICD-10-CM | POA: Diagnosis not present

## 2016-05-30 NOTE — ED Triage Notes (Signed)
Pt reports dropping air gun on right foot on Monday and still having pain and swelling to top of foot. Is able to ambulate on arrival.

## 2016-05-30 NOTE — ED Provider Notes (Signed)
  [facility] Provider Note   CSN: [id number] Arrival date & time: 05/30/16  0802     History   Chief Complaint Chief Complaint  Patient presents with  . Foot Pain    HPI Michael Valencia is a 20 y.o. male.  Pt comes in with c/o right foot pain that started 2 days ago. States that he dropped an airgun on is foot and now he has well to the area. Denies previous injury to the area      History reviewed. No pertinent past medical history.  There are no active problems to display for this patient.   History reviewed. No pertinent surgical history.     Home Medications    Prior to Admission medications   Medication Sig Start Date End Date Taking? Authorizing Provider  naproxen (NAPROSYN) 500 MG tablet Take 1 tablet (500 mg total) by mouth 2 (two) times daily. 10/24/15   Ace GinsSerena Y Sam, PA-C  promethazine (PHENERGAN) 25 MG tablet Take 1 tablet (25 mg total) by mouth every 6 (six) hours as needed for nausea or vomiting. 08/06/15   Azalia BilisKevin Campos, MD    Family History History reviewed. No pertinent family history.  Social History Social History  Substance Use Topics  . Smoking status: Former Smoker    Types: Cigarettes  . Smokeless tobacco: Not on file  . Alcohol use No     Allergies   Review of patient's allergies indicates no known allergies.   Review of Systems Review of Systems  All other systems reviewed and are negative.    Physical Exam Updated Vital Signs BP 133/77 (BP Location: Left Arm)   Pulse 71   Temp 97.3 F (36.3 C) (Oral)   Resp 18   SpO2 100%   Physical Exam  Constitutional: He appears well-developed and well-nourished.  Cardiovascular: Normal rate.   Pulmonary/Chest: Effort normal.  Musculoskeletal: Normal range of motion.  Swelling noted to the top of the right foot. Pulses intact  Neurological: He is alert.  Skin: Capillary refill takes less than 2 seconds.  Nursing note and vitals reviewed.    ED Treatments / Results    Labs (all labs ordered are listed, but only abnormal results are displayed) Labs Reviewed - No data to display  EKG  EKG Interpretation None       Radiology No results found.  Procedures Procedures (including critical care time)  Medications Ordered in ED Medications - No data to display   Initial Impression / Assessment and Plan / ED Course  I have reviewed the triage vital signs and the nursing notes.  Pertinent labs & imaging results that were available during my care of the patient were reviewed by me and considered in my medical decision making (see chart for details).  Clinical Course    No acute bony injury. Pulses intact  Final Clinical Impressions(s) / ED Diagnoses   Final diagnoses:  Contusion of foot, unspecified laterality, initial encounter    New Prescriptions New Prescriptions   No medications on file     Teressa LowerVrinda Elise Knobloch, NP 05/30/16 [id number]    Shaune Pollackameron Isaacs, MD 05/30/16 1756

## 2016-08-28 ENCOUNTER — Encounter (HOSPITAL_COMMUNITY): Payer: Self-pay | Admitting: Emergency Medicine

## 2016-08-28 ENCOUNTER — Emergency Department (HOSPITAL_COMMUNITY)
Admission: EM | Admit: 2016-08-28 | Discharge: 2016-08-28 | Disposition: A | Discharge status: Home / Self Care | Payer: BLUE CROSS/BLUE SHIELD | Attending: Emergency Medicine | Admitting: Emergency Medicine

## 2016-08-28 DIAGNOSIS — M791 Myalgia: Secondary | ICD-10-CM | POA: Insufficient documentation

## 2016-08-28 DIAGNOSIS — R6889 Other general symptoms and signs: Secondary | ICD-10-CM

## 2016-08-28 DIAGNOSIS — R1110 Vomiting, unspecified: Secondary | ICD-10-CM | POA: Insufficient documentation

## 2016-08-28 DIAGNOSIS — Z87891 Personal history of nicotine dependence: Secondary | ICD-10-CM | POA: Insufficient documentation

## 2016-08-28 MED ORDER — ONDANSETRON HCL 4 MG PO TABS: 4.0000 mg | ORAL_TABLET | Freq: Four times a day (QID) | ORAL | 0 refills | Status: DC

## 2016-08-28 MED ORDER — ONDANSETRON 4 MG PO TBDP
4.0000 mg | ORAL_TABLET | Freq: Once | ORAL | Status: AC
  Administered 2016-08-28: 4 mg via ORAL
  Filled 2016-08-28: qty 1

## 2016-08-28 NOTE — ED Provider Notes (Signed)
[facility] Provider Note   CSN: [id number] Arrival date & time: 08/28/16  0735     History   Chief Complaint Chief Complaint  Patient presents with  . Influenza    HPI Michael Valencia is a 21 y.o. male.  HPI   21 year old male presents today with complaints of fatigue. Patient reports that he woke up 3 days ago with acute onset of fatigue, body aches, vomiting and diarrhea. Patient notes upper respiratory congestion and dry nonproductive cough. He reports family members have similar complaints, but had seemed to be improving. Patient denies any respiratory distress, focal abdominal pain, bloody vomit or diarrhea. Patient reports she is a smoker, has no history of asthma or allergies. He has no other chronic health condition. Patient notes fever yesterday, none today. Patient notes taking ibuprofen prior to arrival.     History reviewed. No pertinent past medical history.  There are no active problems to display for this patient.   History reviewed. No pertinent surgical history.     Home Medications    Prior to Admission medications   Medication Sig Start Date End Date Taking? Authorizing Provider  naproxen (NAPROSYN) 500 MG tablet Take 1 tablet (500 mg total) by mouth 2 (two) times daily. 10/24/15   Ace Gins Sam, PA-C  ondansetron (ZOFRAN) 4 MG tablet Take 1 tablet (4 mg total) by mouth every 6 (six) hours. 08/28/16   Eyvonne Mechanic, PA-C  promethazine (PHENERGAN) 25 MG tablet Take 1 tablet (25 mg total) by mouth every 6 (six) hours as needed for nausea or vomiting. 08/06/15   Azalia Bilis, MD    Family History No family history on file.  Social History Social History  Substance Use Topics  . Smoking status: Former Smoker    Types: Cigarettes  . Smokeless tobacco: Not on file  . Alcohol use No     Allergies   Patient has no known allergies.   Review of Systems Review of Systems  All other systems reviewed and are negative.    Physical  Exam Updated Vital Signs BP 129/86   Pulse 67   Temp 98.1 F (36.7 C) (Oral)   Resp 16   Ht 6\' 1"  (1.854 m)   Wt 88.5 kg   SpO2 99%   BMI 25.73 kg/m   Physical Exam  Constitutional: He is oriented to person, place, and time. He appears well-developed and well-nourished.  HENT:  Head: Normocephalic and atraumatic.  Eyes: Conjunctivae are normal. Pupils are equal, round, and reactive to light. Right eye exhibits no discharge. Left eye exhibits no discharge. No scleral icterus.  Neck: Normal range of motion. No JVD present. No tracheal deviation present.  Cardiovascular: Normal rate, regular rhythm and normal heart sounds.   Pulmonary/Chest: Effort normal and breath sounds normal. No stridor. No respiratory distress. He has no wheezes. He has no rales.  Abdominal: Soft. He exhibits no distension. There is no tenderness. There is no guarding.  Neurological: He is alert and oriented to person, place, and time. Coordination normal.  Psychiatric: He has a normal mood and affect. His behavior is normal. Judgment and thought content normal.  Nursing note and vitals reviewed.    ED Treatments / Results  Labs (all labs ordered are listed, but only abnormal results are displayed) Labs Reviewed - No data to display  EKG  EKG Interpretation None       Radiology No results found.  Procedures Procedures (including critical care time)  Medications Ordered in ED Medications  ondansetron (ZOFRAN-ODT) disintegrating tablet 4 mg (4 mg Oral Given 08/28/16 1100)     Initial Impression / Assessment and Plan / ED Course  I have reviewed the triage vital signs and the nursing notes.  Pertinent labs & imaging results that were available during my care of the patient were reviewed by me and considered in my medical decision making (see chart for details).  Clinical Course      Final Clinical Impressions(s) / ED Diagnoses   Final diagnoses:  Flu-like symptoms     Labs:  Imaging:  Consults:  Therapeutics:  Discharge Meds:   Assessment/Plan:  21 year old male presents today with flulike symptoms. He is afebrile nontoxic in no acute distress. He has clear lung sounds and soft benign abdomen. Patient has no risk factors for complicated influenza. He will not be tested or treated today. He is given antinausea medication and encouraged to follow-up immediately if any new or worsening signs or symptoms present. He verbalized his understanding and agreement to today's plan had no further questions or concerns at the time of discharge.   New Prescriptions New Prescriptions   ONDANSETRON (ZOFRAN) 4 MG TABLET    Take 1 tablet (4 mg total) by mouth every 6 (six) hours.     Eyvonne MechanicJeffrey Saragrace Selke, PA-C 08/28/16 1139    Mancel BaleElliott Wentz, MD 08/28/16 [id number]

## 2016-08-28 NOTE — ED Triage Notes (Signed)
Pt c/o nausea, body aches, hot cold chills, HA since Sunday.

## 2016-08-28 NOTE — Discharge Instructions (Signed)
Please read attached information. If you experience any new or worsening signs or symptoms please return to the emergency room for evaluation. Please follow-up with your primary care provider or specialist as discussed. Please use medication prescribed only as directed and discontinue taking if you have any concerning signs or symptoms.   °

## 2016-08-28 NOTE — ED Notes (Signed)
Pen pad not working. Pt ambulatory. No distress, comfortable with discharge instructions.

## 2016-09-04 ENCOUNTER — Emergency Department (HOSPITAL_COMMUNITY)
Admission: EM | Admit: 2016-09-04 | Discharge: 2016-09-04 | Disposition: A | Discharge status: Home / Self Care | Payer: BLUE CROSS/BLUE SHIELD | Attending: Emergency Medicine | Admitting: Emergency Medicine

## 2016-09-04 ENCOUNTER — Encounter (HOSPITAL_COMMUNITY): Payer: Self-pay | Admitting: Emergency Medicine

## 2016-09-04 DIAGNOSIS — K529 Noninfective gastroenteritis and colitis, unspecified: Secondary | ICD-10-CM | POA: Insufficient documentation

## 2016-09-04 DIAGNOSIS — R112 Nausea with vomiting, unspecified: Secondary | ICD-10-CM

## 2016-09-04 DIAGNOSIS — Z87891 Personal history of nicotine dependence: Secondary | ICD-10-CM | POA: Insufficient documentation

## 2016-09-04 DIAGNOSIS — R197 Diarrhea, unspecified: Secondary | ICD-10-CM

## 2016-09-04 LAB — LIPASE, BLOOD: LIPASE: 25 U/L (ref 11–51)

## 2016-09-04 LAB — COMPREHENSIVE METABOLIC PANEL
ALT: 33 U/L (ref 17–63)
AST: 24 U/L (ref 15–41)
Albumin: 4.6 g/dL (ref 3.5–5.0)
Alkaline Phosphatase: 57 U/L (ref 38–126)
Anion gap: 10 (ref 5–15)
BILIRUBIN TOTAL: 0.9 mg/dL (ref 0.3–1.2)
BUN: 14 mg/dL (ref 6–20)
CHLORIDE: 107 mmol/L (ref 101–111)
CO2: 22 mmol/L (ref 22–32)
CREATININE: 0.83 mg/dL (ref 0.61–1.24)
Calcium: 9.4 mg/dL (ref 8.9–10.3)
Glucose, Bld: 125 mg/dL — ABNORMAL HIGH (ref 65–99)
POTASSIUM: 4 mmol/L (ref 3.5–5.1)
Sodium: 139 mmol/L (ref 135–145)
TOTAL PROTEIN: 7 g/dL (ref 6.5–8.1)

## 2016-09-04 LAB — CBC WITH DIFFERENTIAL/PLATELET
Basophils Absolute: 0 10*3/uL (ref 0.0–0.1)
Basophils Relative: 0 %
Eosinophils Absolute: 0 10*3/uL (ref 0.0–0.7)
Eosinophils Relative: 0 %
HCT: 48.2 % (ref 39.0–52.0)
Hemoglobin: 16.6 g/dL (ref 13.0–17.0)
LYMPHS ABS: 0.7 10*3/uL (ref 0.7–4.0)
LYMPHS PCT: 9 %
MCH: 29.3 pg (ref 26.0–34.0)
MCHC: 34.4 g/dL (ref 30.0–36.0)
MCV: 85 fL (ref 78.0–100.0)
MONO ABS: 0.4 10*3/uL (ref 0.1–1.0)
MONOS PCT: 4 %
Neutro Abs: 7.3 10*3/uL (ref 1.7–7.7)
Neutrophils Relative %: 87 %
Platelets: 154 10*3/uL (ref 150–400)
RBC: 5.67 MIL/uL (ref 4.22–5.81)
RDW: 11.9 % (ref 11.5–15.5)
WBC: 8.4 10*3/uL (ref 4.0–10.5)

## 2016-09-04 LAB — URINALYSIS, ROUTINE W REFLEX MICROSCOPIC
Bilirubin Urine: NEGATIVE
Glucose, UA: NEGATIVE mg/dL
HGB URINE DIPSTICK: NEGATIVE
KETONES UR: 5 mg/dL — AB
Leukocytes, UA: NEGATIVE
Nitrite: NEGATIVE
PROTEIN: NEGATIVE mg/dL
Specific Gravity, Urine: 1.025 (ref 1.005–1.030)
pH: 7 (ref 5.0–8.0)

## 2016-09-04 MED ORDER — ONDANSETRON HCL 4 MG PO TABS
4.0000 mg | ORAL_TABLET | Freq: Once | ORAL | Status: AC
  Administered 2016-09-04: 4 mg via ORAL
  Filled 2016-09-04: qty 1

## 2016-09-04 MED ORDER — SODIUM CHLORIDE 0.9 % IV BOLUS (SEPSIS)
1000.0000 mL | Freq: Once | INTRAVENOUS | Status: AC
  Administered 2016-09-04: 1000 mL via INTRAVENOUS

## 2016-09-04 MED ORDER — ONDANSETRON 8 MG PO TBDP: 8.0000 mg | ORAL_TABLET | Freq: Three times a day (TID) | ORAL | 0 refills | Status: DC | PRN

## 2016-09-04 MED ORDER — ONDANSETRON 4 MG PO TBDP
4.0000 mg | ORAL_TABLET | Freq: Once | ORAL | Status: AC
  Administered 2016-09-04: 4 mg via ORAL
  Filled 2016-09-04: qty 1

## 2016-09-04 NOTE — Discharge Instructions (Addendum)
Please return to the ER if your symptoms worsen; you have increased pain, fevers, chills, inability to keep any medications down, confusion or signs of severe dehydration.

## 2016-09-04 NOTE — ED Notes (Signed)
Patient up ambulatory to the bathroom without any difficulty or distress to attempt to provide an urine specimen

## 2016-09-04 NOTE — ED Triage Notes (Signed)
Pt sts N/V/D starting this am with body aches

## 2016-09-04 NOTE — ED Notes (Signed)
Patient undressed, in gown, on continuous pulse oximetry and blood pressure cuff 

## 2016-09-04 NOTE — ED Provider Notes (Signed)
[facility]-EMERGENCY DEPT Provider Note   CSN: [id number] Arrival date & time: 09/04/16  [id number]     History   Chief Complaint Chief Complaint  Patient presents with  . Emesis    HPI Michael Valencia is a 21 y.o. male.  HPI 21 year-old male with no significant PMH who presents to the ED this morning with n/v/d that onset acutely at 3:30 this morning. He also complains of a vague, diffuse abdominal pain. The pt was treated here last week for flu-like symptoms and states that the course of that illness had been improving until this morning. He has vomited 4-5 times and describes it as "dark brown" and "like food". Pt denies fever, but endorses chills and sweats. There is no hematochezia or melena. He also states that several members of his family have been sick with GI-type symptoms recently. Pt has tried oral Zofran without relief. Pt denies recent abx use. Pt had some dizziness when he stood up at arrival.  History reviewed. No pertinent past medical history.  There are no active problems to display for this patient.   History reviewed. No pertinent surgical history.     Home Medications    Prior to Admission medications   Medication Sig Start Date End Date Taking? Authorizing Provider  ondansetron (ZOFRAN) 4 MG tablet Take 1 tablet (4 mg total) by mouth every 6 (six) hours. 08/28/16  Yes Jeffrey Hedges, PA-C  naproxen (NAPROSYN) 500 MG tablet Take 1 tablet (500 mg total) by mouth 2 (two) times daily. Patient not taking: Reported on 09/04/2016 10/24/15   Ace GinsSerena Y Sam, PA-C  promethazine (PHENERGAN) 25 MG tablet Take 1 tablet (25 mg total) by mouth every 6 (six) hours as needed for nausea or vomiting. Patient not taking: Reported on 09/04/2016 08/06/15   Azalia BilisKevin Campos, MD    Family History History reviewed. No pertinent family history.  Social History Social History  Substance Use Topics  . Smoking status: Former Smoker    Types: Cigarettes  . Smokeless tobacco: Not on file  .  Alcohol use No     Allergies   Patient has no known allergies.   Review of Systems Review of Systems  ROS 10 Systems reviewed and are negative for acute change except as noted in the HPI.     Physical Exam Updated Vital Signs BP 131/82 (BP Location: Right Arm)   Pulse 93   Temp 97.6 F (36.4 C) (Oral)   Resp 16   Ht 6\' 1"  (1.854 m)   Wt 185 lb (83.9 kg)   SpO2 100%   BMI 24.41 kg/m   Physical Exam  Constitutional: He is oriented to person, place, and time. He appears well-developed.  HENT:  Head: Normocephalic and atraumatic.  Eyes: Conjunctivae and EOM are normal. Pupils are equal, round, and reactive to light.  Neck: Normal range of motion. Neck supple.  Cardiovascular: Normal rate and regular rhythm.   Pulmonary/Chest: Effort normal and breath sounds normal.  Abdominal: Soft. Bowel sounds are normal. He exhibits no distension. There is no tenderness. There is no rebound and no guarding.  Neurological: He is alert and oriented to person, place, and time.  Skin: Skin is warm. Capillary refill takes 2 to 3 seconds.  Nursing note and vitals reviewed.    ED Treatments / Results  Labs (all labs ordered are listed, but only abnormal results are displayed) Labs Reviewed  COMPREHENSIVE METABOLIC PANEL - Abnormal; Notable for the following:       Result  Value   Glucose, Bld 125 (*)    All other components within normal limits  CBC WITH DIFFERENTIAL/PLATELET  LIPASE, BLOOD  URINALYSIS, ROUTINE W REFLEX MICROSCOPIC    EKG  EKG Interpretation None       Radiology No results found.  Procedures Procedures (including critical care time)  Medications Ordered in ED Medications  sodium chloride 0.9 % bolus 1,000 mL (1,000 mLs Intravenous New Bag/Given 09/04/16 0946)  ondansetron (ZOFRAN) tablet 4 mg (4 mg Oral Given 09/04/16 1100)     Initial Impression / Assessment and Plan / ED Course  I have reviewed the triage vital signs and the nursing  notes.  Pertinent labs & imaging results that were available during my care of the patient were reviewed by me and considered in my medical decision making (see chart for details).  Clinical Course as of Sep 05 730  Tue Sep 04, 2016  1343 Pt reassessed. Pt's VSS and WNL. Pt's cap refill < 3 seconds. Pt has been hydrated in the ER and now passed po challenge. We will discharge with antiemetic. Strict ER return precautions have been discussed and pt will return if he is unable to tolerate fluids and symptoms are getting worse.   [AN]    Clinical Course User Index [AN] Derwood Kaplan, MD   Pt comes in with cc of n/v/diarrhea.  Pt's symptoms are most consistent with acute viral gastroenteritis. Cholecystitis is less likely given absence of focal abdominal findings, negative Murphy sign, and pt demographics. Pancreatitis unlikely in the setting of non-focal abdominal signs and no history of alcoholism or cholelithiasis. PUD was also considered, but is, again, unlikely given the symptom pattern. Bacterial gastroenteritis remains a possibility, but is less likely given the absence of fever. Will check CBC and CMP to assess electrolyte and hydration   Final Clinical Impressions(s) / ED Diagnoses   Final diagnoses:  Gastroenteritis  Nausea vomiting and diarrhea    New Prescriptions New Prescriptions   No medications on file     Derwood Kaplan, MD 09/05/16 [phone]

## 2016-09-04 NOTE — Discharge Planning (Signed)
Pt up for discharge. EDCM reviewed chart for possible CM needs.  No needs identified or communicated.  

## 2016-09-04 NOTE — ED Notes (Signed)
Patient given a cup of ice water to drink; patient aware of need of urine specimen; patient handed an urinal to attempt to provide at this time

## 2017-04-16 ENCOUNTER — Encounter (HOSPITAL_COMMUNITY): Payer: Self-pay

## 2017-04-16 ENCOUNTER — Emergency Department (HOSPITAL_COMMUNITY)
Admission: EM | Admit: 2017-04-16 | Discharge: 2017-04-16 | Disposition: A | Discharge status: Home / Self Care | Payer: BLUE CROSS/BLUE SHIELD | Attending: Emergency Medicine | Admitting: Emergency Medicine

## 2017-04-16 DIAGNOSIS — R1012 Left upper quadrant pain: Secondary | ICD-10-CM

## 2017-04-16 DIAGNOSIS — Z79899 Other long term (current) drug therapy: Secondary | ICD-10-CM | POA: Insufficient documentation

## 2017-04-16 DIAGNOSIS — R1114 Bilious vomiting: Secondary | ICD-10-CM | POA: Insufficient documentation

## 2017-04-16 DIAGNOSIS — Z87891 Personal history of nicotine dependence: Secondary | ICD-10-CM | POA: Diagnosis not present

## 2017-04-16 DIAGNOSIS — R110 Nausea: Secondary | ICD-10-CM | POA: Insufficient documentation

## 2017-04-16 LAB — URINALYSIS, ROUTINE W REFLEX MICROSCOPIC
BILIRUBIN URINE: NEGATIVE
Glucose, UA: NEGATIVE mg/dL
Hgb urine dipstick: NEGATIVE
Ketones, ur: NEGATIVE mg/dL
Leukocytes, UA: NEGATIVE
NITRITE: NEGATIVE
PROTEIN: NEGATIVE mg/dL
SPECIFIC GRAVITY, URINE: 1.014 (ref 1.005–1.030)
pH: 7 (ref 5.0–8.0)

## 2017-04-16 LAB — COMPREHENSIVE METABOLIC PANEL
ALBUMIN: 4.4 g/dL (ref 3.5–5.0)
ALK PHOS: 54 U/L (ref 38–126)
ALT: 14 U/L — ABNORMAL LOW (ref 17–63)
ANION GAP: 8 (ref 5–15)
AST: 18 U/L (ref 15–41)
BUN: 6 mg/dL (ref 6–20)
CALCIUM: 9.7 mg/dL (ref 8.9–10.3)
CHLORIDE: 105 mmol/L (ref 101–111)
CO2: 27 mmol/L (ref 22–32)
Creatinine, Ser: 0.79 mg/dL (ref 0.61–1.24)
GFR calc non Af Amer: >60 mL/min (ref >60)
GLUCOSE: 104 mg/dL — AB (ref 65–99)
POTASSIUM: 4.3 mmol/L (ref 3.5–5.1)
SODIUM: 140 mmol/L (ref 135–145)
Total Bilirubin: 0.8 mg/dL (ref 0.3–1.2)
Total Protein: 6.8 g/dL (ref 6.5–8.1)

## 2017-04-16 LAB — LIPASE, BLOOD: LIPASE: 29 U/L (ref 11–51)

## 2017-04-16 LAB — CBC
HCT: 45.7 % (ref 39.0–52.0)
HEMOGLOBIN: 15.3 g/dL (ref 13.0–17.0)
MCH: 29.7 pg (ref 26.0–34.0)
MCHC: 33.5 g/dL (ref 30.0–36.0)
MCV: 88.6 fL (ref 78.0–100.0)
PLATELETS: 125 10*3/uL — AB (ref 150–400)
RBC: 5.16 MIL/uL (ref 4.22–5.81)
RDW: 12.3 % (ref 11.5–15.5)
WBC: 5.4 10*3/uL (ref 4.0–10.5)

## 2017-04-16 MED ORDER — OMEPRAZOLE 20 MG PO CPDR: 20.0000 mg | DELAYED_RELEASE_CAPSULE | Freq: Every day | ORAL | 0 refills | Status: DC

## 2017-04-16 MED ORDER — ONDANSETRON HCL 4 MG/2ML IJ SOLN
4.0000 mg | Freq: Once | INTRAMUSCULAR | Status: AC
  Administered 2017-04-16: 4 mg via INTRAVENOUS
  Filled 2017-04-16: qty 2

## 2017-04-16 MED ORDER — KETOROLAC TROMETHAMINE 30 MG/ML IJ SOLN
30.0000 mg | Freq: Once | INTRAMUSCULAR | Status: AC
  Administered 2017-04-16: 30 mg via INTRAVENOUS
  Filled 2017-04-16: qty 1

## 2017-04-16 MED ORDER — SODIUM CHLORIDE 0.9 % IV BOLUS (SEPSIS)
1000.0000 mL | Freq: Once | INTRAVENOUS | Status: AC
  Administered 2017-04-16: 1000 mL via INTRAVENOUS

## 2017-04-16 NOTE — Discharge Instructions (Signed)
As discussed, your evaluation today has been largely reassuring.  But, it is important that you monitor your condition carefully, and do not hesitate to return to the ED if you develop new, or concerning changes in your condition. ? ?Otherwise, please follow-up with your physician for appropriate ongoing care. ? ?

## 2017-04-16 NOTE — ED Provider Notes (Signed)
[facility]-EMERGENCY DEPT Provider Note   CSN: [id number] Arrival date & time: 04/16/17  0086     History   Chief Complaint Chief Complaint  Patient presents with  . Back Pain/vomiting    HPI Michael Valencia is a 21 y.o. male.  HPI  atient presents with concern of left-sided back, abdominal pain, nausea, vomiting. Onset was a couple days ago, since onset symptoms of been persistent. Patient notes concurrent anorexia, but no fever, no chills, no other abdominal pain, no dysuria, but there is a limited urine output. No clear alleviating or exacerbating factors, and no relief with OTC medication.  On secondary interview the patient notes that he occasionally uses ibuprofen for pain control.   History reviewed. No pertinent past medical history.  There are no active problems to display for this patient.   History reviewed. No pertinent surgical history.     Home Medications    Prior to Admission medications   Medication Sig Start Date End Date Taking? Authorizing Provider  omeprazole (PRILOSEC) 20 MG capsule Take 1 capsule (20 mg total) by mouth daily. Take one tablet daily 04/16/17   Gerhard Munch, MD  ondansetron (ZOFRAN) 4 MG tablet Take 1 tablet (4 mg total) by mouth every 6 (six) hours. 08/28/16   Hedges, Tinnie Gens, PA-C  ondansetron (ZOFRAN-ODT) 8 MG disintegrating tablet Take 1 tablet (8 mg total) by mouth every 8 (eight) hours as needed for nausea or vomiting. 09/04/16   Derwood Kaplan, MD  promethazine (PHENERGAN) 25 MG tablet Take 1 tablet (25 mg total) by mouth every 6 (six) hours as needed for nausea or vomiting. Patient not taking: Reported on 09/04/2016 08/06/15   Azalia Bilis, MD    Family History No family history on file.  Social History Social History  Substance Use Topics  . Smoking status: Former Smoker    Types: Cigarettes  . Smokeless tobacco: Not on file  . Alcohol use No     Allergies   Patient has no known allergies.   Review of  Systems Review of Systems  Constitutional:       Per HPI, otherwise negative  HENT:       Per HPI, otherwise negative  Respiratory:       Per HPI, otherwise negative  Cardiovascular:       Per HPI, otherwise negative  Gastrointestinal: Positive for nausea and vomiting.  Endocrine:       Negative aside from HPI  Genitourinary:       Neg aside from HPI   Musculoskeletal:       Per HPI, otherwise negative  Skin: Negative.   Neurological: Negative for syncope.     Physical Exam Updated Vital Signs BP 131/62 (BP Location: Right Arm)   Pulse 66   Temp (!) 97.3 F (36.3 C) (Oral)   Resp 16   Ht 6\' 1"  (1.854 m)   Wt 83.9 kg (185 lb)   SpO2 100%   BMI 24.41 kg/m   Physical Exam  Constitutional: He is oriented to person, place, and time. He appears well-developed. No distress.  HENT:  Head: Normocephalic and atraumatic.  Eyes: Conjunctivae and EOM are normal.  Cardiovascular: Normal rate and regular rhythm.   Pulmonary/Chest: Effort normal. No stridor. No respiratory distress.  Abdominal: He exhibits no distension.  Mild ttp about the L upper abd  Musculoskeletal: He exhibits no edema.  Neurological: He is alert and oriented to person, place, and time. No cranial nerve deficit. He exhibits normal muscle tone. Coordination  normal.  Skin: Skin is warm and dry.  Psychiatric: He has a normal mood and affect.  Nursing note and vitals reviewed.    ED Treatments / Results  Labs (all labs ordered are listed, but only abnormal results are displayed) Labs Reviewed  COMPREHENSIVE METABOLIC PANEL - Abnormal; Notable for the following:       Result Value   Glucose, Bld 104 (*)    ALT 14 (*)    All other components within normal limits  CBC - Abnormal; Notable for the following:    Platelets 125 (*)    All other components within normal limits  LIPASE, BLOOD  URINALYSIS, ROUTINE W REFLEX MICROSCOPIC     Procedures Procedures (including critical care time)  Medications  Ordered in ED Medications  sodium chloride 0.9 % bolus 1,000 mL (0 mLs Intravenous Stopped 04/16/17 1229)  ondansetron (ZOFRAN) injection 4 mg (4 mg Intravenous Given 04/16/17 1129)  ketorolac (TORADOL) 30 MG/ML injection 30 mg (30 mg Intravenous Given 04/16/17 1129)     Initial Impression / Assessment and Plan / ED Course  I have reviewed the triage vital signs and the nursing notes.  Pertinent labs & imaging results that were available during my care of the patient were reviewed by me and considered in my medical decision making (see chart for details).  Young male presents with several days of left sided back and abdominal pain. Here the patient is awake, alert, non-peritoneal abdomen, no hemodynamic instability, no fever. Labs reassuring, with no evidence for bacteremia, sepsis. Patient has no evidence for perforation, and her some suspicion for gastric irritation. No evidence for renal pathology. Patient improved here with fluids and antiemetics, anti-inflammatory.   Final Clinical Impressions(s) / ED Diagnoses   Final diagnoses:  Left upper quadrant pain  Bilious vomiting with nausea    New Prescriptions New Prescriptions   OMEPRAZOLE (PRILOSEC) 20 MG CAPSULE    Take 1 capsule (20 mg total) by mouth daily. Take one tablet daily     Gerhard Munch, MD 04/16/17 1312

## 2017-04-16 NOTE — ED Notes (Signed)
Pt ambulated to the Br with steady gait. 

## 2017-04-16 NOTE — ED Triage Notes (Signed)
Patient complains of a few days of lower back pain that is worse with movement. Now complains of left side pain and 2 days of vomiting. No abd. Pain. Alert and oriented, NAD

## 2017-08-09 IMAGING — DX DG FOOT COMPLETE 3+V*R*
3 series · 3 of 3 positions shown · non-contrast
Comparison: None.

CLINICAL DATA: Right foot pain for 2 days.

EXAM:
RIGHT FOOT COMPLETE - 3+ VIEW

[foot ap]
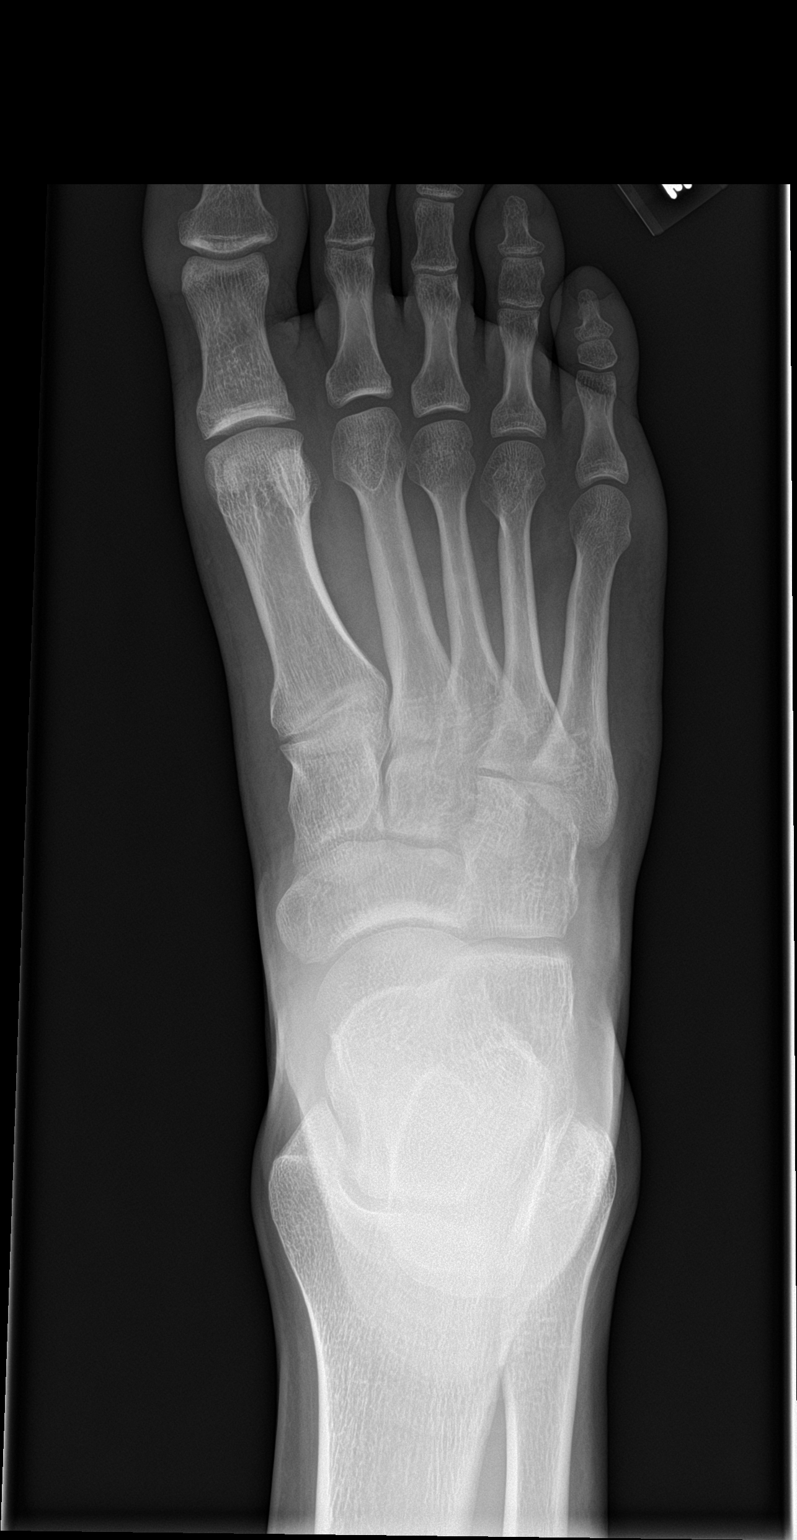

[foot obl]
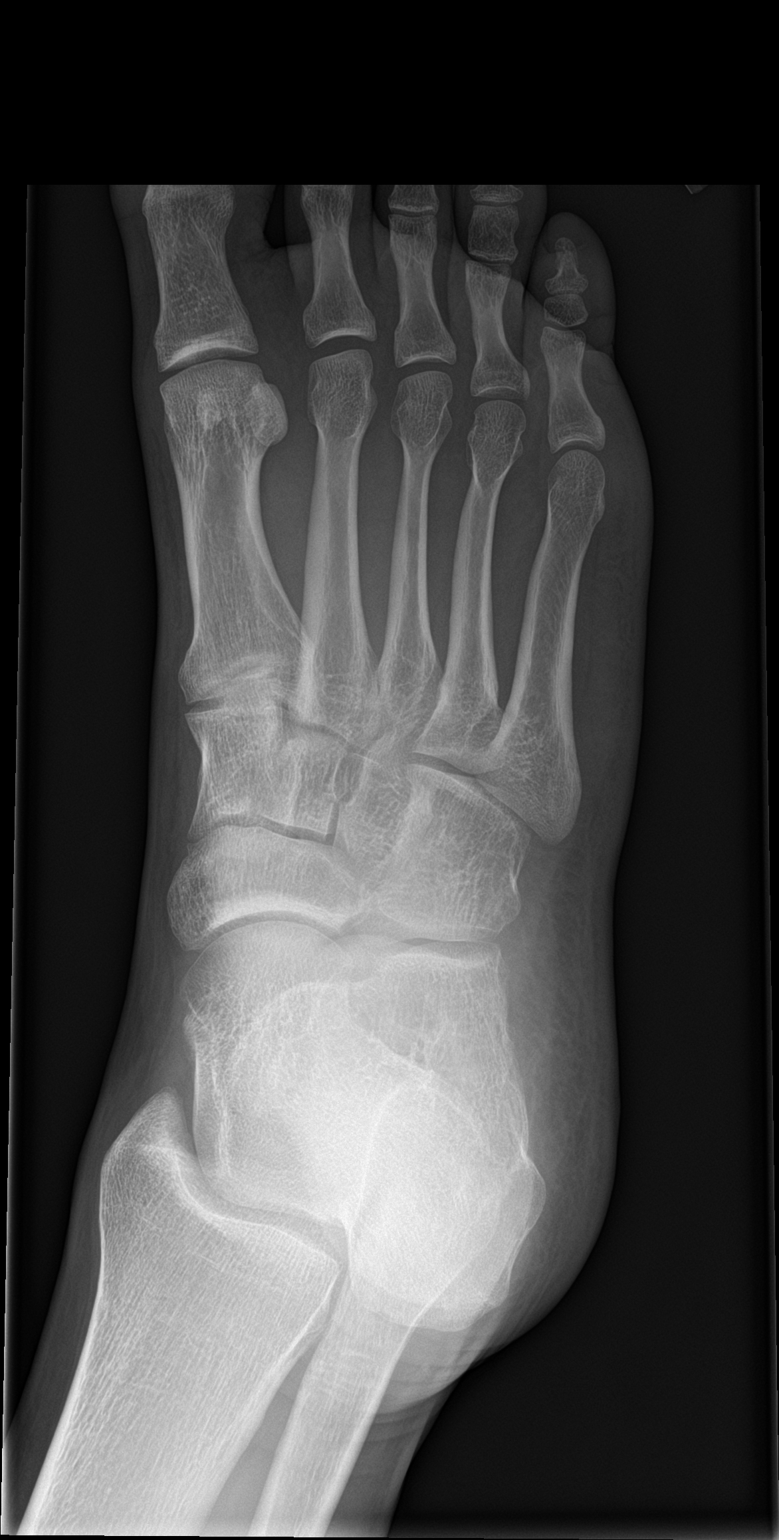

[foot lat]
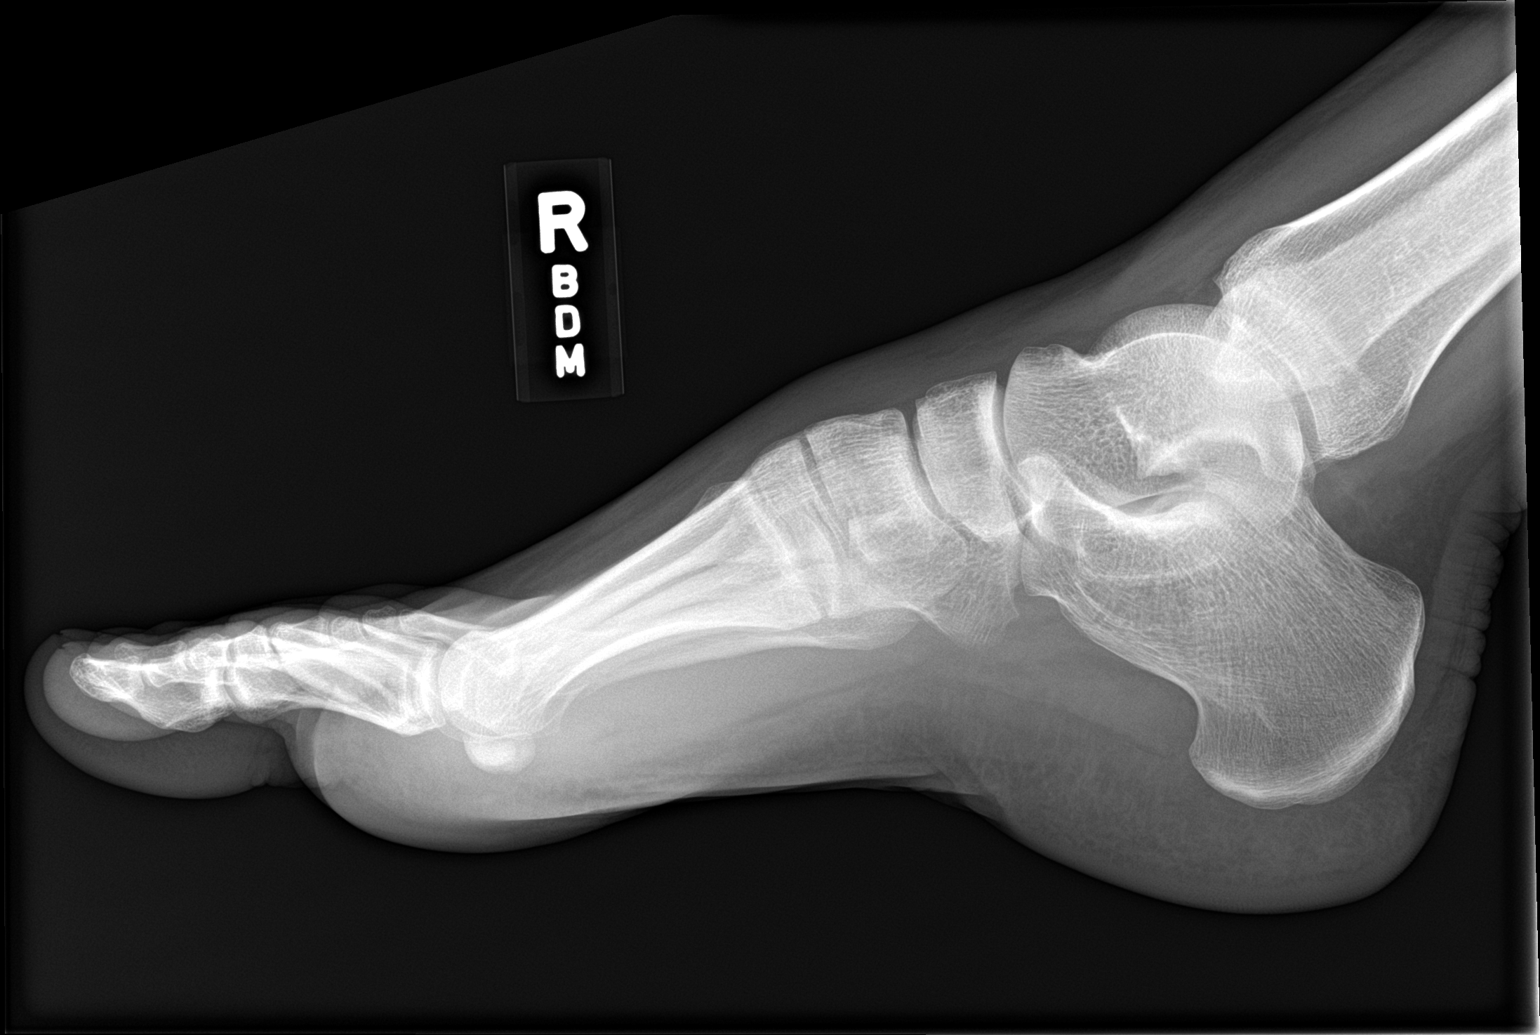

[3 of 3 positions shown; findings below may reference images not displayed]

FINDINGS: There is no evidence of fracture or dislocation. There is no
evidence of arthropathy or other focal bone abnormality. Soft
tissues are unremarkable.
IMPRESSION: Negative.

## 2017-09-30 ENCOUNTER — Other Ambulatory Visit: Payer: Self-pay

## 2017-09-30 ENCOUNTER — Emergency Department (HOSPITAL_COMMUNITY)
Admission: EM | Admit: 2017-09-30 | Discharge: 2017-09-30 | Disposition: A | Discharge status: Home / Self Care | Payer: BLUE CROSS/BLUE SHIELD | Attending: Emergency Medicine | Admitting: Emergency Medicine

## 2017-09-30 ENCOUNTER — Encounter (HOSPITAL_COMMUNITY): Payer: Self-pay

## 2017-09-30 DIAGNOSIS — Z87891 Personal history of nicotine dependence: Secondary | ICD-10-CM | POA: Insufficient documentation

## 2017-09-30 DIAGNOSIS — J069 Acute upper respiratory infection, unspecified: Secondary | ICD-10-CM | POA: Diagnosis not present

## 2017-09-30 DIAGNOSIS — J029 Acute pharyngitis, unspecified: Secondary | ICD-10-CM | POA: Diagnosis not present

## 2017-09-30 DIAGNOSIS — Z79899 Other long term (current) drug therapy: Secondary | ICD-10-CM | POA: Diagnosis not present

## 2017-09-30 MED ORDER — ONDANSETRON 4 MG PO TBDP
4.0000 mg | ORAL_TABLET | Freq: Once | ORAL | Status: AC
  Administered 2017-09-30: 4 mg via ORAL
  Filled 2017-09-30: qty 1

## 2017-09-30 MED ORDER — IBUPROFEN 200 MG PO TABS
600.0000 mg | ORAL_TABLET | Freq: Once | ORAL | Status: AC
  Administered 2017-09-30: 600 mg via ORAL
  Filled 2017-09-30: qty 3

## 2017-09-30 MED ORDER — ACETAMINOPHEN 325 MG PO TABS
650.0000 mg | ORAL_TABLET | Freq: Once | ORAL | Status: AC
  Administered 2017-09-30: 650 mg via ORAL
  Filled 2017-09-30: qty 2

## 2017-09-30 MED ORDER — OSELTAMIVIR PHOSPHATE 75 MG PO CAPS: 75.0000 mg | ORAL_CAPSULE | Freq: Two times a day (BID) | ORAL | 0 refills | Status: DC

## 2017-09-30 MED ORDER — ONDANSETRON HCL 4 MG PO TABS: 4.0000 mg | ORAL_TABLET | Freq: Four times a day (QID) | ORAL | 0 refills | Status: DC

## 2017-09-30 NOTE — ED Triage Notes (Signed)
Patient c/o flu symptoms x 3 days. ( body aches, sore throat,)

## 2017-09-30 NOTE — ED Provider Notes (Signed)
[facility]-EMERGENCY DEPT Provider Note   CSN: [id number] Arrival date & time: 09/30/17  [id number]     History   Chief Complaint Chief Complaint  Patient presents with  . flu symptoms    HPI Michael Valencia is a 22 y.o. male.  22 year old male without significant prior medical history presents with complaint of URI symptoms.  Patient reports that he started to feel bad yesterday.  Patient reports mild congestion, sore throat, muscle aches, and low energy.  Patient reports multiple sick contacts at work.  Patient requests a work note.  Patient denies shortness of breath.  Patient denies cough.  Patient denies fever.  Temperature upon my evaluation is 97.3 orally.   The history is provided by the patient.  URI   This is a new problem. The current episode started yesterday. The problem has not changed since onset.There has been no fever. Associated symptoms include diarrhea, nausea, congestion, rhinorrhea and sore throat. He has tried nothing for the symptoms. The treatment provided no relief.    History reviewed. No pertinent past medical history.  There are no active problems to display for this patient.   History reviewed. No pertinent surgical history.     Home Medications    Prior to Admission medications   Medication Sig Start Date End Date Taking? Authorizing Provider  omeprazole (PRILOSEC) 20 MG capsule Take 1 capsule (20 mg total) by mouth daily. Take one tablet daily 04/16/17   Gerhard MunchLockwood, Robert, MD  ondansetron (ZOFRAN) 4 MG tablet Take 1 tablet (4 mg total) by mouth every 6 (six) hours. 08/28/16   Hedges, Tinnie GensJeffrey, PA-C  ondansetron (ZOFRAN-ODT) 8 MG disintegrating tablet Take 1 tablet (8 mg total) by mouth every 8 (eight) hours as needed for nausea or vomiting. 09/04/16   Derwood KaplanNanavati, Ankit, MD  promethazine (PHENERGAN) 25 MG tablet Take 1 tablet (25 mg total) by mouth every 6 (six) hours as needed for nausea or vomiting. Patient not taking: Reported on  09/04/2016 08/06/15   Azalia Bilisampos, Kevin, MD    Family History Family History  Family history unknown: Yes    Social History Social History   Tobacco Use  . Smoking status: Former Smoker    Types: Cigarettes  . Smokeless tobacco: Never Used  Substance Use Topics  . Alcohol use: Yes    Comment: occasional  . Drug use: Yes    Types: Marijuana    Comment: daily use     Allergies   Patient has no known allergies.   Review of Systems Review of Systems  HENT: Positive for congestion, rhinorrhea and sore throat.   Gastrointestinal: Positive for diarrhea and nausea.  All other systems reviewed and are negative.    Physical Exam Updated Vital Signs BP (!) 142/73   Pulse 87   Resp 16   Ht 6\' 1"  (1.854 m)   Wt 83.9 kg (185 lb)   SpO2 100%   BMI 24.41 kg/m   Physical Exam  Constitutional: He is oriented to person, place, and time. He appears well-developed and well-nourished. No distress.  HENT:  Head: Normocephalic and atraumatic.  Mouth/Throat: Oropharynx is clear and moist.  Eyes: Conjunctivae and EOM are normal. Pupils are equal, round, and reactive to light.  Neck: Normal range of motion. Neck supple.  Cardiovascular: Normal rate, regular rhythm and normal heart sounds.  Pulmonary/Chest: Effort normal and breath sounds normal. No respiratory distress.  Abdominal: Soft. He exhibits no distension. There is no tenderness.  Musculoskeletal: Normal range of motion.  He exhibits no edema or deformity.  Neurological: He is alert and oriented to person, place, and time.  Skin: Skin is warm and dry.  Psychiatric: He has a normal mood and affect.  Nursing note and vitals reviewed.    ED Treatments / Results  Labs (all labs ordered are listed, but only abnormal results are displayed) Labs Reviewed - No data to display  EKG  EKG Interpretation None       Radiology No results found.  Procedures Procedures (including critical care time)  Medications Ordered in  ED Medications  ondansetron (ZOFRAN-ODT) disintegrating tablet 4 mg (not administered)  ibuprofen (ADVIL,MOTRIN) tablet 600 mg (not administered)  acetaminophen (TYLENOL) tablet 650 mg (not administered)     Initial Impression / Assessment and Plan / ED Course  I have reviewed the triage vital signs and the nursing notes.  Pertinent labs & imaging results that were available during my care of the patient were reviewed by me and considered in my medical decision making (see chart for details).     MDM  Screen complete  Patient is presenting with symptoms typical of viral URI.  Patient does not appear to have other significant acute pathology upon history or exam.  Patient will be treated symptomatically.  Patient educated about the use of Tamiflu for possible influenza infection.  Patient understands that testing for influenza does not change the indication for use of Tamiflu. He did not receive a flu shot this season.  Patient understands need for close follow-up.  Strict return precautions given and understood.  Final Clinical Impressions(s) / ED Diagnoses   Final diagnoses:  Upper respiratory tract infection, unspecified type    ED Discharge Orders        Ordered    ondansetron (ZOFRAN) 4 MG tablet  Every 6 hours     09/30/17 0846       Wynetta Fines, MD 09/30/17 [phone]

## 2017-10-03 ENCOUNTER — Emergency Department (HOSPITAL_COMMUNITY)
Admission: EM | Admit: 2017-10-03 | Discharge: 2017-10-03 | Disposition: A | Discharge status: Home / Self Care | Payer: BLUE CROSS/BLUE SHIELD | Attending: Emergency Medicine | Admitting: Emergency Medicine

## 2017-10-03 ENCOUNTER — Encounter (HOSPITAL_COMMUNITY): Payer: Self-pay

## 2017-10-03 DIAGNOSIS — Z87891 Personal history of nicotine dependence: Secondary | ICD-10-CM | POA: Insufficient documentation

## 2017-10-03 DIAGNOSIS — R197 Diarrhea, unspecified: Secondary | ICD-10-CM | POA: Diagnosis not present

## 2017-10-03 DIAGNOSIS — R112 Nausea with vomiting, unspecified: Secondary | ICD-10-CM | POA: Insufficient documentation

## 2017-10-03 NOTE — ED Provider Notes (Signed)
[facility] EMERGENCY DEPARTMENT Provider Note   CSN: [id number] Arrival date & time: 10/03/17  0935     History   Chief Complaint Chief Complaint  Patient presents with  . recheck flu    HPI Michael Valencia is a 22 y.o. male.  HPI   22 year old male presents with nausea vomiting diarrhea.  Patient reports several days ago patient developed flulike symptoms along with his dad.  He was seen in the ER for his condition and was prescribed Tamiflu and antinausea medication.  He took it for the past 4 days with improvement of his symptoms however today while at work patient developed nausea, vomited twice, and having some loose stools without blood or mucus.  He also endorsed generalized abdominal cramping.  He denies any associated fever chest pain or shortness of breath.  He did have one day left of the medication.  History reviewed. No pertinent past medical history.  There are no active problems to display for this patient.   History reviewed. No pertinent surgical history.     Home Medications    Prior to Admission medications   Medication Sig Start Date End Date Taking? Authorizing Provider  omeprazole (PRILOSEC) 20 MG capsule Take 1 capsule (20 mg total) by mouth daily. Take one tablet daily 04/16/17   Gerhard Munch, MD  ondansetron (ZOFRAN) 4 MG tablet Take 1 tablet (4 mg total) by mouth every 6 (six) hours. 08/28/16   Hedges, Tinnie Gens, PA-C  ondansetron (ZOFRAN) 4 MG tablet Take 1 tablet (4 mg total) by mouth every 6 (six) hours. 09/30/17   Wynetta Fines, MD  ondansetron (ZOFRAN-ODT) 8 MG disintegrating tablet Take 1 tablet (8 mg total) by mouth every 8 (eight) hours as needed for nausea or vomiting. 09/04/16   Derwood Kaplan, MD  oseltamivir (TAMIFLU) 75 MG capsule Take 1 capsule (75 mg total) by mouth every 12 (twelve) hours. 09/30/17   Wynetta Fines, MD  promethazine (PHENERGAN) 25 MG tablet Take 1 tablet (25 mg total) by mouth every 6 (six)  hours as needed for nausea or vomiting. Patient not taking: Reported on 09/04/2016 08/06/15   Azalia Bilis, MD    Family History Family History  Family history unknown: Yes    Social History Social History   Tobacco Use  . Smoking status: Former Smoker    Types: Cigarettes  . Smokeless tobacco: Never Used  Substance Use Topics  . Alcohol use: Yes    Comment: occasional  . Drug use: Yes    Types: Marijuana    Comment: daily use     Allergies   Patient has no known allergies.   Review of Systems Review of Systems  All other systems reviewed and are negative.    Physical Exam Updated Vital Signs BP (!) 141/78 (BP Location: Right Arm)   Pulse 78   Temp 98.3 F (36.8 C) (Oral)   Resp 16   Ht 6\' 1"  (1.854 m)   Wt 83.9 kg (185 lb)   SpO2 99%   BMI 24.41 kg/m   Physical Exam  Constitutional: He appears well-developed and well-nourished. No distress.  HENT:  Head: Atraumatic.  Right Ear: External ear normal.  Left Ear: External ear normal.  Mouth/Throat: Oropharynx is clear and moist.  Eyes: Conjunctivae are normal.  Neck: Neck supple.  Cardiovascular: Normal rate and regular rhythm.  Pulmonary/Chest: Effort normal and breath sounds normal.  Abdominal: Soft. Bowel sounds are normal. He exhibits no distension. There is tenderness (Very  mild generalized abdominal tenderness without focal point tenderness and no peritoneal sign).  Neurological: He is alert.  Skin: No rash noted.  Psychiatric: He has a normal mood and affect.  Nursing note and vitals reviewed.    ED Treatments / Results  Labs (all labs ordered are listed, but only abnormal results are displayed) Labs Reviewed - No data to display  EKG  EKG Interpretation None       Radiology No results found.  Procedures Procedures (including critical care time)  Medications Ordered in ED Medications - No data to display   Initial Impression / Assessment and Plan / ED Course  I have  reviewed the triage vital signs and the nursing notes.  Pertinent labs & imaging results that were available during my care of the patient were reviewed by me and considered in my medical decision making (see chart for details).     BP (!) 141/78 (BP Location: Right Arm)   Pulse 78   Temp 98.3 F (36.8 C) (Oral)   Resp 16   Ht 6\' 1"  (1.854 m)   Wt 83.9 kg (185 lb)   SpO2 99%   BMI 24.41 kg/m    Final Clinical Impressions(s) / ED Diagnoses   Final diagnoses:  Nausea vomiting and diarrhea    ED Discharge Orders    None     11:21 AM Patient here with nausea vomiting diarrhea after taking Tamiflu.  This is likely a side effect of the medication.  He has a fairly benign abdominal exam.  I encourage patient to stop taking his antibiotic and continue with symptomatic treatment including hydration, Zofran, Tylenol.  Work note provided.   Fayrene Helperran, Jayvan Mcshan, PA-C 10/03/17 1124    Tilden Fossaees, Elizabeth, MD 10/04/17 [phone]

## 2017-10-03 NOTE — Discharge Instructions (Signed)
Your symptoms may be a side effect of Tamiflu.  Please discontinue this medication.  Stay hydrated.  Return if you notice worsening abdominal pain or if you have any concerns.

## 2017-10-03 NOTE — ED Triage Notes (Signed)
Patient diagnosed with flu on Monday and taking tamiflu. This am vomited and had episode of diarrhea. Here to make sure related to meds. No further G complaints

## 2017-10-08 ENCOUNTER — Emergency Department (HOSPITAL_COMMUNITY)
Admission: EM | Admit: 2017-10-08 | Discharge: 2017-10-08 | Disposition: A | Discharge status: Home / Self Care | Payer: BLUE CROSS/BLUE SHIELD | Attending: Emergency Medicine | Admitting: Emergency Medicine

## 2017-10-08 ENCOUNTER — Other Ambulatory Visit: Payer: Self-pay

## 2017-10-08 ENCOUNTER — Emergency Department (HOSPITAL_COMMUNITY): Payer: BLUE CROSS/BLUE SHIELD

## 2017-10-08 ENCOUNTER — Encounter (HOSPITAL_COMMUNITY): Payer: Self-pay | Admitting: Emergency Medicine

## 2017-10-08 DIAGNOSIS — F17210 Nicotine dependence, cigarettes, uncomplicated: Secondary | ICD-10-CM | POA: Diagnosis not present

## 2017-10-08 DIAGNOSIS — M4126 Other idiopathic scoliosis, lumbar region: Secondary | ICD-10-CM | POA: Diagnosis not present

## 2017-10-08 DIAGNOSIS — Y999 Unspecified external cause status: Secondary | ICD-10-CM | POA: Insufficient documentation

## 2017-10-08 DIAGNOSIS — X58XXXA Exposure to other specified factors, initial encounter: Secondary | ICD-10-CM | POA: Diagnosis not present

## 2017-10-08 DIAGNOSIS — Z79899 Other long term (current) drug therapy: Secondary | ICD-10-CM | POA: Insufficient documentation

## 2017-10-08 DIAGNOSIS — Y92018 Other place in single-family (private) house as the place of occurrence of the external cause: Secondary | ICD-10-CM | POA: Insufficient documentation

## 2017-10-08 DIAGNOSIS — S39012A Strain of muscle, fascia and tendon of lower back, initial encounter: Secondary | ICD-10-CM | POA: Diagnosis not present

## 2017-10-08 DIAGNOSIS — Y9389 Activity, other specified: Secondary | ICD-10-CM | POA: Insufficient documentation

## 2017-10-08 DIAGNOSIS — M545 Low back pain: Secondary | ICD-10-CM | POA: Diagnosis not present

## 2017-10-08 DIAGNOSIS — S3992XA Unspecified injury of lower back, initial encounter: Secondary | ICD-10-CM | POA: Diagnosis not present

## 2017-10-08 MED ORDER — CYCLOBENZAPRINE HCL 5 MG PO TABS: 5.0000 mg | ORAL_TABLET | Freq: Three times a day (TID) | ORAL | 0 refills | Status: DC | PRN

## 2017-10-08 MED ORDER — KETOROLAC TROMETHAMINE 60 MG/2ML IM SOLN
60.0000 mg | Freq: Once | INTRAMUSCULAR | Status: AC
  Administered 2017-10-08: 60 mg via INTRAMUSCULAR
  Filled 2017-10-08: qty 2

## 2017-10-08 MED ORDER — IBUPROFEN 800 MG PO TABS: 800.0000 mg | ORAL_TABLET | Freq: Three times a day (TID) | ORAL | 0 refills | Status: DC

## 2017-10-08 NOTE — ED Triage Notes (Signed)
Chronic low back pain has been hurting worse since playing with his nephew the other night

## 2017-10-08 NOTE — Discharge Instructions (Signed)
Avoid lifting,  Bending,  Twisting or any other activity that worsens your pain over the next week.  Apply an  icepack  to your lower back for 10-15 minutes every 2 hours for the next 2 days after which you can add a heating pad several times daily.  You should get rechecked if your symptoms are not better over the next 5 days,  Or you develop increased pain,  Weakness in your leg(s) or loss of bladder or bowel function - these are symptoms of a worse injury. Your xrays show you have very mild scoliosis which can sometimes explain back pain problems.

## 2017-10-08 NOTE — ED Notes (Signed)
Pt reports back pain "for awhile"   Reports no PCP  Here for eval

## 2017-10-09 NOTE — ED Provider Notes (Signed)
[facility] EMERGENCY DEPARTMENT Provider Note   CSN: [id number] Arrival date & time: 10/08/17  1144     History   Chief Complaint Chief Complaint  Patient presents with  . Back Pain    HPI Michael Valencia is a 22 y.o. male presenting mid lower back pain which started yesterday evening while playing with his nephews.  He describes simply lying on the floor during which time his pain began.  Describes constant aching with some sharp nonradiating stabs with movement.  He denies numbness or weakness in his legs and has had no urinary or bowel retention or incontinence.  He reports having a prior episode of pain last summer with sudden onset when stepping into a boat.  He has had taken ibuprofen 400 mg prior to arrival with mild improvement.  The history is provided by the patient.    History reviewed. No pertinent past medical history.  There are no active problems to display for this patient.   History reviewed. No pertinent surgical history.     Home Medications    Prior to Admission medications   Medication Sig Start Date End Date Taking? Authorizing Provider  cyclobenzaprine (FLEXERIL) 5 MG tablet Take 1 tablet (5 mg total) by mouth 3 (three) times daily as needed for muscle spasms. 10/08/17   Burgess Amor, PA-C  ibuprofen (ADVIL,MOTRIN) 800 MG tablet Take 1 tablet (800 mg total) by mouth 3 (three) times daily. 10/08/17   Burgess Amor, PA-C  omeprazole (PRILOSEC) 20 MG capsule Take 1 capsule (20 mg total) by mouth daily. Take one tablet daily 04/16/17   Gerhard Munch, MD  ondansetron (ZOFRAN) 4 MG tablet Take 1 tablet (4 mg total) by mouth every 6 (six) hours. 08/28/16   Hedges, Tinnie Gens, PA-C  ondansetron (ZOFRAN) 4 MG tablet Take 1 tablet (4 mg total) by mouth every 6 (six) hours. 09/30/17   Wynetta Fines, MD  ondansetron (ZOFRAN-ODT) 8 MG disintegrating tablet Take 1 tablet (8 mg total) by mouth every 8 (eight) hours as needed for nausea or vomiting. 09/04/16   Derwood Kaplan, MD  oseltamivir (TAMIFLU) 75 MG capsule Take 1 capsule (75 mg total) by mouth every 12 (twelve) hours. 09/30/17   Wynetta Fines, MD  promethazine (PHENERGAN) 25 MG tablet Take 1 tablet (25 mg total) by mouth every 6 (six) hours as needed for nausea or vomiting. Patient not taking: Reported on 09/04/2016 08/06/15   Azalia Bilis, MD    Family History Family History  Family history unknown: Yes    Social History Social History   Tobacco Use  . Smoking status: Current Every Day Smoker    Packs/day: 0.50    Types: Cigarettes  . Smokeless tobacco: Never Used  Substance Use Topics  . Alcohol use: Yes    Comment: occasional  . Drug use: Yes    Types: Marijuana    Comment: daily use     Allergies   Patient has no known allergies.   Review of Systems Review of Systems  Constitutional: Negative for fever.  Respiratory: Negative for shortness of breath.   Cardiovascular: Negative for chest pain and leg swelling.  Gastrointestinal: Negative for abdominal distention, abdominal pain and constipation.  Genitourinary: Negative for difficulty urinating, dysuria, flank pain, frequency and urgency.  Musculoskeletal: Positive for back pain. Negative for gait problem and joint swelling.  Skin: Negative for rash.  Neurological: Negative for weakness and numbness.     Physical Exam Updated Vital Signs BP 120/63 (BP Location: Right  Arm)   Pulse 64   Temp 98 F (36.7 C) (Oral)   Resp 16   Ht 6\' 1"  (1.854 m)   Wt 83.9 kg (185 lb)   SpO2 99%   BMI 24.41 kg/m   Physical Exam  Constitutional: He appears well-developed and well-nourished.  HENT:  Head: Normocephalic.  Eyes: Conjunctivae are normal.  Neck: Normal range of motion. Neck supple.  Cardiovascular: Normal rate and intact distal pulses.  Pedal pulses normal.  Pulmonary/Chest: Effort normal.  Abdominal: Soft. Bowel sounds are normal. He exhibits no distension and no mass.  Musculoskeletal: Normal range of motion.  He exhibits no edema.       Lumbar back: He exhibits tenderness. He exhibits no swelling, no edema and no spasm.  ttp lower lumbar midline, no edema, no deformity noted.  Neurological: He is alert. He has normal strength. He displays no atrophy and no tremor. No sensory deficit. Gait normal.  No strength deficit noted in hip and knee flexor and extensor muscle groups.  Ankle flexion and extension intact.  Skin: Skin is warm and dry.  Psychiatric: He has a normal mood and affect.  Nursing note and vitals reviewed.    ED Treatments / Results  Labs (all labs ordered are listed, but only abnormal results are displayed) Labs Reviewed - No data to display  EKG  EKG Interpretation None       Radiology Dg Lumbar Spine Complete  Result Date: 10/08/2017 CLINICAL DATA:  Lumbago with right-sided radicular symptoms EXAM: LUMBAR SPINE - COMPLETE 4+ VIEW COMPARISON:  None. FINDINGS: Frontal, lateral, spot lumbosacral lateral, and bilateral oblique views were obtained. There are 5 non-rib-bearing lumbar type vertebral bodies. There is lower lumbar levoscoliosis. No fracture or spondylolisthesis. Disk spaces appear normal. There is facet osteoarthritic change at L5-S1 bilaterally. IMPRESSION: Scoliosis with mild facet osteoarthritic change at L5-S1. No appreciable disc space narrowing. No fracture or spondylolisthesis. Electronically Signed   By: Bretta BangWilliam  Woodruff III M.D.   On: 10/08/2017 14:58    Procedures Procedures (including critical care time)  Medications Ordered in ED Medications  ketorolac (TORADOL) injection 60 mg (60 mg Intramuscular Given 10/08/17 1510)     Initial Impression / Assessment and Plan / ED Course  I have reviewed the triage vital signs and the nursing notes.  Pertinent labs & imaging results that were available during my care of the patient were reviewed by me and considered in my medical decision making (see chart for details).     Imaging obtained given pt age  and recurrent pain. He was not aware of scoliosis which was discussed, imaging reviewed with pt.  He was placed on flexeril, ibuprofen, discussed heat tx. Referrals given to establish primary care.   No neuro deficit on exam or by history to suggest emergent or surgical presentation.  Outlined sx that should prompt immediate re-evaluation including distal weakness, bowel/bladder retention/incontinence.        Final Clinical Impressions(s) / ED Diagnoses   Final diagnoses:  Strain of lumbar region, initial encounter  Other idiopathic scoliosis, lumbar region    ED Discharge Orders        Ordered    cyclobenzaprine (FLEXERIL) 5 MG tablet  3 times daily PRN     10/08/17 1510    ibuprofen (ADVIL,MOTRIN) 800 MG tablet  3 times daily     10/08/17 1510       Burgess Amordol, Hakiem Malizia, PA-C 10/09/17 0818    Mesner, Barbara CowerJason, MD 10/09/17 1756

## 2017-10-18 ENCOUNTER — Ambulatory Visit: Payer: BLUE CROSS/BLUE SHIELD | Admitting: Internal Medicine

## 2017-10-18 ENCOUNTER — Other Ambulatory Visit (INDEPENDENT_AMBULATORY_CARE_PROVIDER_SITE_OTHER): Payer: BLUE CROSS/BLUE SHIELD

## 2017-10-18 ENCOUNTER — Encounter: Payer: Self-pay | Admitting: Internal Medicine

## 2017-10-18 VITALS — BP 122/84 | HR 74 | Temp 98.3°F | Ht 73.0 in | Wt 200.0 lb

## 2017-10-18 DIAGNOSIS — Z0000 Encounter for general adult medical examination without abnormal findings: Secondary | ICD-10-CM | POA: Diagnosis not present

## 2017-10-18 DIAGNOSIS — M545 Low back pain, unspecified: Secondary | ICD-10-CM | POA: Insufficient documentation

## 2017-10-18 DIAGNOSIS — Z114 Encounter for screening for human immunodeficiency virus [HIV]: Secondary | ICD-10-CM | POA: Diagnosis not present

## 2017-10-18 DIAGNOSIS — Z0001 Encounter for general adult medical examination with abnormal findings: Secondary | ICD-10-CM | POA: Insufficient documentation

## 2017-10-18 DIAGNOSIS — M419 Scoliosis, unspecified: Secondary | ICD-10-CM

## 2017-10-18 HISTORY — DX: Scoliosis, unspecified: M41.9

## 2017-10-18 LAB — CBC WITH DIFFERENTIAL/PLATELET
BASOS ABS: 0 10*3/uL (ref 0.0–0.1)
Basophils Relative: 0.6 % (ref 0.0–3.0)
EOS ABS: 0 10*3/uL (ref 0.0–0.7)
Eosinophils Relative: 0.6 % (ref 0.0–5.0)
HCT: 46.1 % (ref 39.0–52.0)
Hemoglobin: 15.9 g/dL (ref 13.0–17.0)
LYMPHS ABS: 2 10*3/uL (ref 0.7–4.0)
Lymphocytes Relative: 42.2 % (ref 12.0–46.0)
MCHC: 34.5 g/dL (ref 30.0–36.0)
MCV: 87.1 fl (ref 78.0–100.0)
Monocytes Absolute: 0.2 10*3/uL (ref 0.1–1.0)
Monocytes Relative: 4.6 % (ref 3.0–12.0)
NEUTROS ABS: 2.5 10*3/uL (ref 1.4–7.7)
Neutrophils Relative %: 52 % (ref 43.0–77.0)
PLATELETS: 161 10*3/uL (ref 150.0–400.0)
RBC: 5.29 Mil/uL (ref 4.22–5.81)
RDW: 12.4 % (ref 11.5–15.5)
WBC: 4.8 10*3/uL (ref 4.0–10.5)

## 2017-10-18 LAB — BASIC METABOLIC PANEL
BUN: 16 mg/dL (ref 6–23)
CHLORIDE: 103 meq/L (ref 96–112)
CO2: 27 meq/L (ref 19–32)
CREATININE: 0.87 mg/dL (ref 0.40–1.50)
Calcium: 10.3 mg/dL (ref 8.4–10.5)
GFR: 116.72 mL/min (ref >60.00)
Glucose, Bld: 96 mg/dL (ref 70–99)
Potassium: 4.7 mEq/L (ref 3.5–5.1)
Sodium: 140 mEq/L (ref 135–145)

## 2017-10-18 LAB — URINALYSIS, ROUTINE W REFLEX MICROSCOPIC
BILIRUBIN URINE: NEGATIVE
HGB URINE DIPSTICK: NEGATIVE
Ketones, ur: NEGATIVE
LEUKOCYTES UA: NEGATIVE
NITRITE: NEGATIVE
RBC / HPF: NONE SEEN (ref 0–?)
Specific Gravity, Urine: 1.02 (ref 1.000–1.030)
TOTAL PROTEIN, URINE-UPE24: NEGATIVE
URINE GLUCOSE: NEGATIVE
Urobilinogen, UA: 0.2 (ref 0.0–1.0)
pH: 6 (ref 5.0–8.0)

## 2017-10-18 LAB — TSH: TSH: 1.26 u[IU]/mL (ref 0.35–4.50)

## 2017-10-18 LAB — LIPID PANEL
CHOL/HDL RATIO: 4
Cholesterol: 214 mg/dL — ABNORMAL HIGH (ref 0–200)
HDL: 47.9 mg/dL (ref >39.00)
LDL Cholesterol: 152 mg/dL — ABNORMAL HIGH (ref 0–99)
NonHDL: 166.31
TRIGLYCERIDES: 70 mg/dL (ref 0.0–149.0)
VLDL: 14 mg/dL (ref 0.0–40.0)

## 2017-10-18 LAB — HEPATIC FUNCTION PANEL
ALBUMIN: 4.6 g/dL (ref 3.5–5.2)
ALK PHOS: 50 U/L (ref 39–117)
ALT: 39 U/L (ref 0–53)
AST: 15 U/L (ref 0–37)
BILIRUBIN DIRECT: 0.1 mg/dL (ref 0.0–0.3)
Total Bilirubin: 0.4 mg/dL (ref 0.2–1.2)
Total Protein: 7.9 g/dL (ref 6.0–8.3)

## 2017-10-18 MED ORDER — PREDNISONE 10 MG PO TABS: ORAL_TABLET | ORAL | 0 refills | Status: DC

## 2017-10-18 MED ORDER — CYCLOBENZAPRINE HCL 5 MG PO TABS: 5.0000 mg | ORAL_TABLET | Freq: Three times a day (TID) | ORAL | 1 refills | Status: DC | PRN

## 2017-10-18 MED ORDER — TRAMADOL HCL 50 MG PO TABS: 50.0000 mg | ORAL_TABLET | Freq: Four times a day (QID) | ORAL | 1 refills | Status: DC | PRN

## 2017-10-18 NOTE — Assessment & Plan Note (Signed)

## 2017-10-18 NOTE — Assessment & Plan Note (Addendum)
Mod to severe again, I think most likely related to facet syndrome and muscular spasm, for pain control, predpac asd, and muscle relaxer, with consderation for sport med referral if not improved; work note given  In addition to the time spent performing CPE, I spent an additional 15 minutes face to face,in which greater than 50% of this time was spent in counseling and coordination of care for patient's illness as documented, including the differential dx, treatment, further evaluation and other management of low back pain new onset

## 2017-10-18 NOTE — Progress Notes (Signed)
Subjective:    Patient ID: Michael Valencia, male    DOB: 1995-12-18, 22 y.o.   MRN: [id number]  HPI   Here for wellness and f/u;  Overall doing ok;  Pt denies Chest pain, worsening SOB, DOE, wheezing, orthopnea, PND, worsening LE edema, palpitations, dizziness or syncope.  Pt denies neurological change such as new headache, facial or extremity weakness.  Pt denies polydipsia, polyuria, or low sugar symptoms. Pt states overall good compliance with treatment and medications, good tolerability, and has been trying to follow appropriate diet.  Pt denies worsening depressive symptoms, suicidal ideation or panic. No fever, night sweats, wt loss, loss of appetite, or other constitutional symptoms.  Pt states good ability with ADL's, has low fall risk, home safety reviewed and adequate, no other significant changes in hearing or vision, and only occasionally active with exercise. Declines flu shot Also, C/o 2 wks acute onset constant LBP, severe, sharp pain, started with playing football with kids and bent to get the football. 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 Missed 1 wk work starting Feb 19, had been back to work this wk but had to leave again at half day due to pain worsening again.  Was seen in ED wit films significant for scoliosis and mild facet DJD only.   No recent trauma or ralls.  Pt denies bowel or bladder change, fever, wt loss, or falls. Tx with high dose ibuprofen and muscle relaxer not helping.  No other interval hx or new complaints Past Medical History:  Diagnosis Date  . Scoliosis 10/18/2017   No past surgical history on file.  reports that he has been smoking cigarettes.  He has been smoking about 0.50 packs per day. he has never used smokeless tobacco. He reports that he drinks alcohol. He reports that he uses drugs. Drug: Marijuana. Family  history is unknown by patient. No Known Allergies Current Outpatient Medications on File Prior to Visit  Medication Sig Dispense Refill  . ibuprofen (ADVIL,MOTRIN) 800 MG tablet Take 1 tablet (800 mg total) by mouth 3 (three) times daily. 21 tablet 0  . ondansetron (ZOFRAN-ODT) 8 MG disintegrating tablet Take 1 tablet (8 mg total) by mouth every 8 (eight) hours as needed for nausea or vomiting. 20 tablet 0   No current facility-administered medications on file prior to visit.    Review of Systems Constitutional: Negative for other unusual diaphoresis, sweats, appetite or weight changes HENT: Negative for other worsening hearing loss, ear pain, facial swelling, mouth sores or neck stiffness.   Eyes: Negative for other worsening pain, redness or other visual disturbance.  Respiratory: Negative for other stridor or swelling Cardiovascular: Negative for other palpitations or other chest pain  Gastrointestinal: Negative for worsening diarrhea or loose stools, blood in stool, distention or other pain Genitourinary: Negative for hematuria, flank pain or other change in urine volume.  Musculoskeletal: Negative for myalgias or other joint swelling.  Skin: Negative for other color change, or other wound or worsening drainage.  Neurological: Negative for other syncope or numbness. Hematological: Negative for other adenopathy or swelling Psychiatric/Behavioral: Negative for hallucinations, other worsening agitation, SI, self-injury, or new decreased concentration All other system neg per pt    Objective:   Physical Exam BP 122/84   Pulse 74   Temp 98.3 F (36.8 C) (Other (Comment))   Ht  6\' 1"  (1.854 m)   Wt 200 lb (90.7 kg)   SpO2 98%   BMI 26.39 kg/m  VS noted,  Constitutional: Pt is oriented to person, place, and time. Appears well-developed and well-nourished, in no significant distress and comfortable Head: Normocephalic and atraumatic  Eyes: Conjunctivae and EOM are normal. Pupils are  equal, round, and reactive to light Right Ear: External ear normal without discharge Left Ear: External ear normal without discharge Nose: Nose without discharge or deformity Mouth/Throat: Oropharynx is without other ulcerations and moist  Neck: Normal range of motion. Neck supple. No JVD present. No tracheal deviation present or significant neck LA or mass Cardiovascular: Normal rate, regular rhythm, normal heart sounds and intact distal pulses.   Pulmonary/Chest: WOB normal and breath sounds without rales or wheezing  Abdominal: Soft. Bowel sounds are normal. NT. No HSM  Musculoskeletal: Normal range of motion. Exhibits no edema except for tender right lowest lumbar midline and right tender muscular spasm paralumbar, without rash or other swelling. Lymphadenopathy: Has no other cervical adenopathy.  Neurological: Pt is alert and oriented to person, place, and time. Pt has normal reflexes. No cranial nerve deficit. Motor grossly intact, Gait intact Skin: Skin is warm and dry. No rash noted or new ulcerations Psychiatric:  Has normal mood and affect. Behavior is normal without agitation No other exam findings  Lab Results  Component Value Date   WBC 5.4 04/16/2017   HGB 15.3 04/16/2017   HCT 45.7 04/16/2017   PLT 125 (L) 04/16/2017   GLUCOSE 104 (H) 04/16/2017   ALT 14 (L) 04/16/2017   AST 18 04/16/2017   NA 140 04/16/2017   K 4.3 04/16/2017   CL 105 04/16/2017   CREATININE 0.79 04/16/2017   BUN 6 04/16/2017   CO2 27 04/16/2017       Assessment & Plan:

## 2017-10-18 NOTE — Patient Instructions (Signed)
Please take all new medication as prescribed - the tramadol for pain, and prednisone  Please continue all other medications as before, including the muscle relaxer  Please have the pharmacy call with any other refills you may need.  Please continue your efforts at being more active, low cholesterol diet, and weight control.  You are otherwise up to date with prevention measures today.  Please keep your appointments with your specialists as you may have planned  Please go to the LAB in the Basement (turn left off the elevator) for the tests to be done today  You will be contacted by phone if any changes need to be made immediately.  Otherwise, you will receive a letter about your results with an explanation, but please check with MyChart first.  Please remember to sign up for MyChart if you have not done so, as this will be important to you in the future with finding out test results, communicating by private email, and scheduling acute appointments online when needed.  Please return in 1 year for your yearly visit, or sooner if needed

## 2017-10-19 LAB — HIV ANTIBODY (ROUTINE TESTING W REFLEX): HIV 1&2 Ab, 4th Generation: NONREACTIVE

## 2017-11-27 ENCOUNTER — Other Ambulatory Visit (INDEPENDENT_AMBULATORY_CARE_PROVIDER_SITE_OTHER): Payer: BLUE CROSS/BLUE SHIELD

## 2017-11-27 ENCOUNTER — Ambulatory Visit: Payer: BLUE CROSS/BLUE SHIELD | Admitting: Internal Medicine

## 2017-11-27 ENCOUNTER — Encounter: Payer: Self-pay | Admitting: Internal Medicine

## 2017-11-27 VITALS — BP 120/76 | HR 80 | Temp 98.4°F | Ht 73.0 in | Wt 195.0 lb

## 2017-11-27 DIAGNOSIS — R300 Dysuria: Secondary | ICD-10-CM | POA: Insufficient documentation

## 2017-11-27 DIAGNOSIS — Z202 Contact with and (suspected) exposure to infections with a predominantly sexual mode of transmission: Secondary | ICD-10-CM | POA: Diagnosis not present

## 2017-11-27 LAB — URINALYSIS, ROUTINE W REFLEX MICROSCOPIC
Bilirubin Urine: NEGATIVE
Hgb urine dipstick: NEGATIVE
KETONES UR: NEGATIVE
Leukocytes, UA: NEGATIVE
NITRITE: NEGATIVE
RBC / HPF: NONE SEEN (ref 0–?)
SPECIFIC GRAVITY, URINE: 1.02 (ref 1.000–1.030)
Total Protein, Urine: NEGATIVE
Urine Glucose: NEGATIVE
Urobilinogen, UA: 0.2 (ref 0.0–1.0)
WBC UA: NONE SEEN (ref 0–?)
pH: 7 (ref 5.0–8.0)

## 2017-11-27 MED ORDER — CIPROFLOXACIN HCL 500 MG PO TABS: ORAL_TABLET | ORAL | 0 refills | Status: DC

## 2017-11-27 MED ORDER — CEFTRIAXONE SODIUM 1 G IJ SOLR
1.0000 g | Freq: Once | INTRAMUSCULAR | Status: AC
  Administered 2017-11-27: 1 g via INTRAMUSCULAR

## 2017-11-27 NOTE — Patient Instructions (Addendum)
You had the antibiotic shot today  Please take all new medication as prescribed - the pill antibiotics  Please continue all other medications as before, and refills have been done if requested.  Please have the pharmacy call with any other refills you may need.  Please keep your appointments with your specialists as you may have planned  Please go to the LAB in the Basement (turn left off the elevator) for the tests to be done today  You will be contacted by phone if any changes need to be made immediately.  Otherwise, you will receive a letter about your results with an explanation, but please check with MyChart first.  Please remember to sign up for MyChart if you have not done so, as this will be important to you in the future with finding out test results, communicating by private email, and scheduling acute appointments online when needed.

## 2017-11-27 NOTE — Progress Notes (Signed)
   Subjective:    Patient ID: Michael Valencia, male    DOB: 05/09/1996, 22 y.o.   MRN: [id number]  HPI  Here with c/o 2 days onset mild dysuria, frequency and ?penile d/c after intercourse.  Partner today notified him + for chlamydia.  Denies urinary symptoms such as urgency, flank pain, hematuria or n/v, fever, chills.  Pt denies chest pain, increased sob or doe, wheezing, orthopnea, PND, increased LE swelling, palpitations, dizziness or syncope.   Pt denies polydipsia, polyuria No other interval hx or new complaint Past Medical History:  Diagnosis Date  . Scoliosis 10/18/2017   No past surgical history on file.  reports that he has been smoking cigarettes.  He has been smoking about 0.50 packs per day. He has never used smokeless tobacco. He reports that he drinks alcohol. He reports that he has current or past drug history. Drug: Marijuana. Family history is unknown by patient. No Known Allergies Current Outpatient Medications on File Prior to Visit  Medication Sig Dispense Refill  . cyclobenzaprine (FLEXERIL) 5 MG tablet Take 1 tablet (5 mg total) by mouth 3 (three) times daily as needed for muscle spasms. 40 tablet 1  . ibuprofen (ADVIL,MOTRIN) 800 MG tablet Take 1 tablet (800 mg total) by mouth 3 (three) times daily. 21 tablet 0  . ondansetron (ZOFRAN-ODT) 8 MG disintegrating tablet Take 1 tablet (8 mg total) by mouth every 8 (eight) hours as needed for nausea or vomiting. 20 tablet 0  . predniSONE (DELTASONE) 10 MG tablet 3 tabs by mouth per day for 3 days,2tabs per day for 3 days,1tab per day for 3 days 18 tablet 0  . traMADol (ULTRAM) 50 MG tablet Take 1 tablet (50 mg total) by mouth every 6 (six) hours as needed. 60 tablet 1   No current facility-administered medications on file prior to visit.    Review of Systems  Constitutional: Negative for other unusual diaphoresis or sweats HENT: Negative for ear discharge or swelling Eyes: Negative for other worsening visual  disturbances Respiratory: Negative for stridor or other swelling  Gastrointestinal: Negative for worsening distension or other blood Genitourinary: Negative for retention or other urinary change Musculoskeletal: Negative for other MSK pain or swelling Skin: Negative for color change or other new lesions Neurological: Negative for worsening tremors and other numbness  Psychiatric/Behavioral: Negative for worsening agitation or other fatigue All other system neg per pt    Objective:   Physical Exam BP 120/76   Pulse 80   Temp 98.4 F (36.9 C) (Oral)   Ht 6\' 1"  (1.854 m)   Wt 195 lb (88.5 kg)   SpO2 99%   BMI 25.73 kg/m  VS noted,  Constitutional: Pt appears in NAD HENT: Head: NCAT.  Right Ear: External ear normal.  Left Ear: External ear normal.  Eyes: . Pupils are equal, round, and reactive to light. Conjunctivae and EOM are normal Nose: without d/c or deformity Neck: Neck supple. Gross normal ROM Cardiovascular: Normal rate and regular rhythm.   Pulmonary/Chest: Effort normal and breath sounds without rales or wheezing.  Abd:  Soft, NT, ND, + BS, no organomegaly, no flank tender GU: normal male ext genitalia, no rash, swelling, ulcer or penile d/c observed Neurological: Pt is alert. At baseline orientation, motor grossly intact Skin: Skin is warm. No rashes, other new lesions, no LE edema Psychiatric: Pt behavior is normal without agitation  No other exam findings       Assessment & Plan:

## 2017-11-28 LAB — URINE CULTURE
MICRO NUMBER:: 90442439
Result:: NO GROWTH
SPECIMEN QUALITY:: ADEQUATE

## 2017-11-30 NOTE — Assessment & Plan Note (Signed)
Per pt report + hx of chlamydia exposure, for rocephin IM, cipro asd,  to f/u any worsening symptoms or concerns

## 2017-11-30 NOTE — Assessment & Plan Note (Signed)
Ok for urine studies, r/o UTI

## 2018-04-04 ENCOUNTER — Ambulatory Visit: Payer: BLUE CROSS/BLUE SHIELD | Admitting: Internal Medicine

## 2018-04-04 ENCOUNTER — Encounter: Payer: Self-pay | Admitting: Internal Medicine

## 2018-04-04 VITALS — BP 150/90 | HR 85 | Temp 98.4°F | Ht 73.0 in | Wt 205.0 lb

## 2018-04-04 DIAGNOSIS — M47816 Spondylosis without myelopathy or radiculopathy, lumbar region: Secondary | ICD-10-CM | POA: Insufficient documentation

## 2018-04-04 DIAGNOSIS — M419 Scoliosis, unspecified: Secondary | ICD-10-CM | POA: Diagnosis not present

## 2018-04-04 DIAGNOSIS — M545 Low back pain, unspecified: Secondary | ICD-10-CM

## 2018-04-04 DIAGNOSIS — R030 Elevated blood-pressure reading, without diagnosis of hypertension: Secondary | ICD-10-CM | POA: Diagnosis not present

## 2018-04-04 MED ORDER — PREDNISONE 10 MG PO TABS: ORAL_TABLET | ORAL | 0 refills | Status: DC

## 2018-04-04 MED ORDER — TRAMADOL HCL 50 MG PO TABS: 50.0000 mg | ORAL_TABLET | Freq: Four times a day (QID) | ORAL | 1 refills | Status: DC | PRN

## 2018-04-04 MED ORDER — CYCLOBENZAPRINE HCL 5 MG PO TABS: 5.0000 mg | ORAL_TABLET | Freq: Three times a day (TID) | ORAL | 1 refills | Status: DC | PRN

## 2018-04-04 MED ORDER — KETOROLAC TROMETHAMINE 30 MG/ML IJ SOLN
30.0000 mg | Freq: Once | INTRAMUSCULAR | Status: AC
  Administered 2018-04-04: 30 mg via INTRAMUSCULAR

## 2018-04-04 NOTE — Progress Notes (Signed)
Subjective:    Patient ID: Michael Valencia, male    DOB: 07/17/1996, 22 y.o.   MRN: [id number]  HPI  Here with 2 wks onset LBP, sharp, constant, moderate to occas severe, more right side than left and did have an episode pain pain radiating to the right knee with lying down yesterday, but o/w no, bowel or bladder change, fever, wt loss,  worsening LE pain/numbness/weakness, gait change or falls. Has known hx of lumbar facet dz l5-s1.  No heavy lifting or bending except was fishing and may have sat in one position too long  BP not known to be high in the past.  Pt denies chest pain, increased sob or doe, wheezing, orthopnea, PND, increased LE swelling, palpitations, dizziness or syncope. BP Readings from Last 3 Encounters:  04/04/18 (!) 150/90  11/27/17 120/76  10/18/17 122/84   Past Medical History:  Diagnosis Date  . Scoliosis 10/18/2017   History reviewed. No pertinent surgical history.  reports that he has been smoking cigarettes. He has been smoking about 0.50 packs per day. He has never used smokeless tobacco. He reports that he drinks alcohol. He reports that he has current or past drug history. Drug: Marijuana. Family history is unknown by patient. No Known Allergies No current outpatient medications on file prior to visit.   No current facility-administered medications on file prior to visit.    Review of Systems  Constitutional: Negative for other unusual diaphoresis or sweats HENT: Negative for ear discharge or swelling Eyes: Negative for other worsening visual disturbances Respiratory: Negative for stridor or other swelling  Gastrointestinal: Negative for worsening distension or other blood Genitourinary: Negative for retention or other urinary change Musculoskeletal: Negative for other MSK pain or swelling Skin: Negative for color change or other new lesions Neurological: Negative for worsening tremors and other numbness  Psychiatric/Behavioral: Negative for worsening  agitation or other fatigue No other exam findings     Objective:   Physical Exam BP (!) 150/90 (BP Location: Left Arm, Patient Position: Sitting, Cuff Size: Normal)   Pulse 85   Temp 98.4 F (36.9 C) (Oral)   Ht 6\' 1"  (1.854 m)   Wt 205 lb (93 kg)   SpO2 97%   BMI 27.05 kg/m  VS noted,  Constitutional: Pt appears in NAD HENT: Head: NCAT.  Right Ear: External ear normal.  Left Ear: External ear normal.  Eyes: . Pupils are equal, round, and reactive to light. Conjunctivae and EOM are normal Nose: without d/c or deformity Neck: Neck supple. Gross normal ROM Cardiovascular: Normal rate and regular rhythm.   Pulmonary/Chest: Effort normal and breath sounds without rales or wheezing.  Abd:  Soft, NT, ND, + BS, no organomegaly Spine: mild tender lowest lumbar and right lumbar paravertebral Neurological: Pt is alert. At baseline orientation, motor grossly intact Skin: Skin is warm. No rashes, other new lesions, no LE edema Psychiatric: Pt behavior is normal without agitation  No other exam findings LUMBAR SPINE - COMPLETE 4+ VIEW  COMPARISON:  None.  FINDINGS: Frontal, lateral, spot lumbosacral lateral, and bilateral oblique views were obtained. There are 5 non-rib-bearing lumbar type vertebral bodies. There is lower lumbar levoscoliosis. No fracture or spondylolisthesis. Disk spaces appear normal. There is facet osteoarthritic change at L5-S1 bilaterally.  IMPRESSION: Scoliosis with mild facet osteoarthritic change at L5-S1. No appreciable disc space narrowing. No fracture or spondylolisthesis.   Electronically Signed   By: Bretta BangWilliam  Woodruff III M.D.   On: 10/08/2017 14:58    Assessment &  Plan:

## 2018-04-04 NOTE — Patient Instructions (Signed)
You had the pain shot today (toradol 30 mg)  Please take all new medication as prescribed - the pain medication, prednisone, and muscle relaxer as needed  We may need to refer to orthopedic if this becomes a recurrent problem to consider cortisone shot to the lower back  Please continue all other medications as before, and refills have been done if requested.  Please have the pharmacy call with any other refills you may need.  Please keep your appointments with your specialists as you may have planned

## 2018-04-04 NOTE — Assessment & Plan Note (Signed)
Likely reactive, ok to follow for persistent elevation > 140.90, no specific tx except for above

## 2018-04-04 NOTE — Assessment & Plan Note (Signed)
Second episode this yr similar symptoms c/w facet syndrome right side with possible mild muscle strain on the right, but no clear sciatica or LE weakness/focal change; has known lumbar facet dx from feb 2019 plain films; will hold on further imaging, but for toradol 30 mg IM x 1, tramadol prn, predpac asd, and trial flexeril prn; may need to see ortho for possible ESI for recurrent issues

## 2018-04-04 NOTE — Assessment & Plan Note (Signed)
Mild, doubt a significant issue with his pain,  to f/u any worsening symptoms or concerns

## 2018-05-13 DIAGNOSIS — M5417 Radiculopathy, lumbosacral region: Secondary | ICD-10-CM | POA: Diagnosis not present

## 2018-05-13 DIAGNOSIS — M4126 Other idiopathic scoliosis, lumbar region: Secondary | ICD-10-CM | POA: Diagnosis not present

## 2018-05-13 DIAGNOSIS — S338XXA Sprain of other parts of lumbar spine and pelvis, initial encounter: Secondary | ICD-10-CM | POA: Diagnosis not present

## 2018-05-13 DIAGNOSIS — S335XXA Sprain of ligaments of lumbar spine, initial encounter: Secondary | ICD-10-CM | POA: Diagnosis not present

## 2018-07-18 ENCOUNTER — Emergency Department (HOSPITAL_COMMUNITY)
Admission: EM | Admit: 2018-07-18 | Discharge: 2018-07-18 | Disposition: A | Discharge status: Home / Self Care | Payer: BLUE CROSS/BLUE SHIELD | Attending: Emergency Medicine | Admitting: Emergency Medicine

## 2018-07-18 ENCOUNTER — Encounter (HOSPITAL_COMMUNITY): Payer: Self-pay | Admitting: Emergency Medicine

## 2018-07-18 ENCOUNTER — Other Ambulatory Visit: Payer: Self-pay

## 2018-07-18 DIAGNOSIS — F1290 Cannabis use, unspecified, uncomplicated: Secondary | ICD-10-CM | POA: Insufficient documentation

## 2018-07-18 DIAGNOSIS — R1084 Generalized abdominal pain: Secondary | ICD-10-CM | POA: Insufficient documentation

## 2018-07-18 DIAGNOSIS — F17210 Nicotine dependence, cigarettes, uncomplicated: Secondary | ICD-10-CM | POA: Diagnosis not present

## 2018-07-18 DIAGNOSIS — R030 Elevated blood-pressure reading, without diagnosis of hypertension: Secondary | ICD-10-CM

## 2018-07-18 DIAGNOSIS — R197 Diarrhea, unspecified: Secondary | ICD-10-CM | POA: Insufficient documentation

## 2018-07-18 DIAGNOSIS — R112 Nausea with vomiting, unspecified: Secondary | ICD-10-CM | POA: Insufficient documentation

## 2018-07-18 LAB — CBC WITH DIFFERENTIAL/PLATELET
ABS IMMATURE GRANULOCYTES: 0.01 10*3/uL (ref 0.00–0.07)
Basophils Absolute: 0 10*3/uL (ref 0.0–0.1)
Basophils Relative: 1 %
Eosinophils Absolute: 0 10*3/uL (ref 0.0–0.5)
Eosinophils Relative: 1 %
HCT: 49.9 % (ref 39.0–52.0)
Hemoglobin: 16.7 g/dL (ref 13.0–17.0)
Immature Granulocytes: 0 %
Lymphocytes Relative: 28 %
Lymphs Abs: 1.1 10*3/uL (ref 0.7–4.0)
MCH: 29.5 pg (ref 26.0–34.0)
MCHC: 33.5 g/dL (ref 30.0–36.0)
MCV: 88 fL (ref 80.0–100.0)
Monocytes Absolute: 0.4 10*3/uL (ref 0.1–1.0)
Monocytes Relative: 10 %
Neutro Abs: 2.5 10*3/uL (ref 1.7–7.7)
Neutrophils Relative %: 60 %
Platelets: 153 10*3/uL (ref 150–400)
RBC: 5.67 MIL/uL (ref 4.22–5.81)
RDW: 11.5 % (ref 11.5–15.5)
WBC: 4.1 10*3/uL (ref 4.0–10.5)
nRBC: 0 % (ref 0.0–0.2)

## 2018-07-18 LAB — COMPREHENSIVE METABOLIC PANEL
ALBUMIN: 4.3 g/dL (ref 3.5–5.0)
ALK PHOS: 64 U/L (ref 38–126)
ALT: 36 U/L (ref 0–44)
AST: 21 U/L (ref 15–41)
Anion gap: 8 (ref 5–15)
BILIRUBIN TOTAL: 0.8 mg/dL (ref 0.3–1.2)
BUN: 11 mg/dL (ref 6–20)
CALCIUM: 9.3 mg/dL (ref 8.9–10.3)
CO2: 24 mmol/L (ref 22–32)
Chloride: 106 mmol/L (ref 98–111)
Creatinine, Ser: 0.86 mg/dL (ref 0.61–1.24)
GFR calc Af Amer: >60 mL/min (ref >60)
GLUCOSE: 107 mg/dL — AB (ref 70–99)
Potassium: 4.3 mmol/L (ref 3.5–5.1)
Sodium: 138 mmol/L (ref 135–145)
TOTAL PROTEIN: 7 g/dL (ref 6.5–8.1)

## 2018-07-18 LAB — MAGNESIUM: Magnesium: 2 mg/dL (ref 1.7–2.4)

## 2018-07-18 MED ORDER — ONDANSETRON 4 MG PO TBDP: 4.0000 mg | ORAL_TABLET | Freq: Three times a day (TID) | ORAL | 0 refills | Status: AC | PRN

## 2018-07-18 MED ORDER — LACTATED RINGERS IV BOLUS
1000.0000 mL | Freq: Once | INTRAVENOUS | Status: AC
  Administered 2018-07-18: 1000 mL via INTRAVENOUS

## 2018-07-18 MED ORDER — ONDANSETRON HCL 4 MG/2ML IJ SOLN
4.0000 mg | Freq: Once | INTRAMUSCULAR | Status: AC
  Administered 2018-07-18: 4 mg via INTRAVENOUS
  Filled 2018-07-18: qty 2

## 2018-07-18 NOTE — ED Provider Notes (Signed)
[facility] EMERGENCY DEPARTMENT Provider Note   CSN: [id number] Arrival date & time: 07/18/18  0734     History   Chief Complaint Chief Complaint  Patient presents with  . Abdominal Pain  . Nausea  . Emesis  . Diarrhea    HPI DIA Michael Valencia is a 22 y.o. male.  HPI  Patient is a 22 year old male with a history of scoliosis presenting for nausea, vomiting, diarrhea, and abdominal cramping.  Patient reports that his symptoms began 2 days ago.  He reports that he is having approximately 3 episodes of nonbilious and nonbloody emesis per day, as well as 3 episodes of watery stools without melena or hematochezia.  He reports that he experiences some periumbilical cramping, approximately 5 out of 10 in severity prior to throwing up.  Denies any other focal abdominal tenderness.  Patient denies any fevers or chills.  He reports that prior to the onset of his symptoms he ate hotdogs with his family, and no one else has become ill, however he reports that he did have cheese that no one else had.  Denies any romaine lettuce consumption in the past week.  Reports that he is tried eating a clear liquid diet, but is having difficulty holding food or fluids down.  Past Medical History:  Diagnosis Date  . Scoliosis 10/18/2017    Patient Active Problem List   Diagnosis Date Noted  . 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 Blood pressure elevated without history of HTN 04/04/2018  . Dysuria 11/27/2017  . STD exposure 11/27/2017  . Scoliosis 10/18/2017  . Encounter for well adult exam with abnormal findings 10/18/2017  . Low back pain 10/18/2017    History reviewed. No pertinent surgical history.      Home Medications    Prior to Admission medications   Medication Sig Start Date End Date Taking? Authorizing Provider  cyclobenzaprine (FLEXERIL) 5 MG tablet Take 1 tablet (5 mg total) by mouth 3 (three) times daily as needed for muscle spasms.  04/04/18   Corwin Levins, MD  predniSONE (DELTASONE) 10 MG tablet 3 tabs by mouth per day for 3 days,2tabs per day for 3 days,1tab per day for 3 days 04/04/18   Corwin Levins, MD  traMADol (ULTRAM) 50 MG tablet Take 1 tablet (50 mg total) by mouth every 6 (six) hours as needed. 04/04/18   Corwin Levins, MD    Family History Family History  Family history unknown: Yes    Social History Social History   Tobacco Use  . Smoking status: Current Every Day Smoker    Packs/day: 0.50    Types: Cigarettes  . Smokeless tobacco: Never Used  Substance Use Topics  . Alcohol use: Yes    Comment: occasional  . Drug use: Yes    Types: Marijuana    Comment: daily use     Allergies   Patient has no known allergies.   Review of Systems Review of Systems  Constitutional: Negative for chills and fever.  HENT: Negative for congestion and sore throat.   Eyes: Negative for visual disturbance.  Respiratory: Negative for cough, chest tightness and shortness of breath.   Cardiovascular: Negative for chest pain.  Gastrointestinal: Positive for abdominal pain, diarrhea, nausea and vomiting. Negative for abdominal distention and blood in stool.  Genitourinary: Negative for dysuria and flank pain.  Musculoskeletal: Negative for back pain and myalgias.  Skin: Negative for rash.  Neurological: Negative for dizziness, syncope and  light-headedness.     Physical Exam Updated Vital Signs BP (!) 145/92 (BP Location: Right Arm)   Pulse 82   Temp 98.2 F (36.8 C) (Oral)   Resp 17   SpO2 100%   Physical Exam  Constitutional: He appears well-developed and well-nourished. No distress.  HENT:  Head: Normocephalic and atraumatic.  Mouth/Throat: Oropharynx is clear and moist.  Eyes: Pupils are equal, round, and reactive to light. Conjunctivae and EOM are normal.  Neck: Normal range of motion. Neck supple.  Cardiovascular: Normal rate, regular rhythm, S1 normal and S2 normal.  No murmur  heard. Pulmonary/Chest: Effort normal and breath sounds normal. He has no wheezes. He has no rales.  Abdominal: Soft. Bowel sounds are normal. He exhibits no distension. There is no tenderness. There is no guarding.  Musculoskeletal: Normal range of motion. He exhibits no edema or deformity.  Lymphadenopathy:    He has no cervical adenopathy.  Neurological: He is alert.  Cranial nerves grossly intact. Patient moves extremities symmetrically and with good coordination.  Skin: Skin is warm and dry. No rash noted. No erythema.  Psychiatric: He has a normal mood and affect. His behavior is normal. Judgment and thought content normal.  Nursing note and vitals reviewed.    ED Treatments / Results  Labs (all labs ordered are listed, but only abnormal results are displayed) Labs Reviewed  COMPREHENSIVE METABOLIC PANEL - Abnormal; Notable for the following components:      Result Value   Glucose, Bld 107 (*)    All other components within normal limits  CBC WITH DIFFERENTIAL/PLATELET  MAGNESIUM    EKG None  Radiology No results found.  Procedures Procedures (including critical care time)  Medications Ordered in ED Medications  lactated ringers bolus 1,000 mL (has no administration in time range)  ondansetron (ZOFRAN) injection 4 mg (has no administration in time range)     Initial Impression / Assessment and Plan / ED Course  I have reviewed the triage vital signs and the nursing notes.  Pertinent labs & imaging results that were available during my care of the patient were reviewed by me and considered in my medical decision making (see chart for details).     Patient is nontoxic-appearing, afebrile, and hemodynamically stable.  No signs or symptoms of volume depletion on exam today.  Suspect gastroenteritis, and with 2 days of symptoms and no fever or bloody stools, suspect viral gastroneuritis at this time.  Will hydrate, check basic lab work, and p.o.  challenge.  Elevated blood pressure noted to patient today.  Appears that he is previously had elevated blood pressures in the primary care provider's office.  I recommended follow-up for further management.  Patient ambulatory, tolerating p.o., and in no acute distress at the end of the emergency department examination.  Patient prescribed Zofran for home.  Discussed return precautions such as intractable nausea and vomiting, fever, or bloody diarrhea.  Patient is in understanding and agrees with the plan of care.  Final Clinical Impressions(s) / ED Diagnoses   Final diagnoses:  Nausea vomiting and diarrhea  Elevated blood pressure reading in office without diagnosis of hypertension    ED Discharge Orders         Ordered    ondansetron (ZOFRAN-ODT) 4 MG disintegrating tablet  Every 8 hours PRN     07/18/18 0901           Elisha PonderMurray, Deniah Saia B, PA-C 07/18/18 1215    Doug SouJacubowitz, Sam, MD 07/18/18 [phone]

## 2018-07-18 NOTE — ED Triage Notes (Signed)
Pt. Stated, Michael Valencia had some N/V/D since Wed. Night.

## 2018-07-18 NOTE — Discharge Instructions (Signed)
Please read and follow all provided instructions.  Your diagnoses today include:  1. Nausea vomiting and diarrhea   2. Elevated blood pressure reading in office without diagnosis of hypertension    Your lab work is very reassuring today.  There are no abnormalities.  Nausea, vomiting, and diarrhea is often viral in nature due to something you eat, and needs to run its course.  Tests performed today include: Blood counts and electrolytes Blood tests to check liver and kidney function Blood tests to check pancreas function Urine test to look for infection and pregnancy (in women) Vital signs. See below for your results today.   Medications prescribed:   Take any prescribed medications only as directed.  You are prescribed Zofran.  This medication for nausea.  You may take 1 oral dissolving tablet every 8 hours as needed for nausea and vomiting.  Home care instructions:  Follow any educational materials contained in this packet.  Your abdominal pain, nausea, vomiting, and diarrhea may be caused by a viral gastroenteritis also called 'stomach flu'. You should rest for the next several days. Keep drinking plenty of fluids and use the medicine for nausea as directed.   Drink clear liquids for the next 24 hours and introduce solid foods slowly after 24 hours using the b.r.a.t. diet (Bananas, Rice, Applesauce, Toast, Yogurt).    Follow-up instructions: Please follow-up with your primary care provider in the next 2 days for further evaluation of your symptoms. If you are not feeling better in 48 hours you may have a condition that is more serious and you need re-evaluation.   Return instructions:  SEEK IMMEDIATE MEDICAL ATTENTION IF: If you have pain that does not go away or becomes severe  A temperature above 101F develops  Repeated vomiting occurs (multiple episodes)  If you have pain that becomes localized to portions of the abdomen. The right side could possibly be appendicitis. In an  adult, the left lower portion of the abdomen could be colitis or diverticulitis.  Blood is being passed in stools or vomit (bright red or black tarry stools)  You develop chest pain, difficulty breathing, dizziness or fainting, or become confused, poorly responsive, or inconsolable (young children) If you have any other emergent concerns regarding your health  Additional Information: Abdominal (belly) pain can be caused by many things. Your caregiver performed an examination and possibly ordered blood/urine tests and imaging (CT scan, x-rays, ultrasound). Many cases can be observed and treated at home after initial evaluation in the emergency department. Even though you are being discharged home, abdominal pain can be unpredictable. Therefore, you need a repeated exam if your pain does not resolve, returns, or worsens. Most patients with abdominal pain don't have to be admitted to the hospital or have surgery, but serious problems like appendicitis and gallbladder attacks can start out as nonspecific pain. Many abdominal conditions cannot be diagnosed in one visit, so follow-up evaluations are very important.  Your vital signs today were: BP (!) 145/92 (BP Location: Right Arm)    Pulse 82    Temp 98.2 F (36.8 C) (Oral)    Resp 17    SpO2 100%  If your blood pressure (bp) was elevated above 135/85 this visit, please have this repeated by your doctor within one month. --------------

## 2018-07-18 NOTE — ED Notes (Signed)
Patient verbalized understanding of discharge instructions and denies any further needs or questions at this time. VS stable. Patient ambulatory with steady gait.  

## 2018-08-26 ENCOUNTER — Encounter: Payer: Self-pay | Admitting: Internal Medicine

## 2018-08-26 ENCOUNTER — Ambulatory Visit: Payer: BLUE CROSS/BLUE SHIELD | Admitting: Internal Medicine

## 2018-08-26 VITALS — BP 120/76 | HR 85 | Temp 98.5°F | Ht 73.0 in | Wt 217.0 lb

## 2018-08-26 DIAGNOSIS — M542 Cervicalgia: Secondary | ICD-10-CM

## 2018-08-26 DIAGNOSIS — J069 Acute upper respiratory infection, unspecified: Secondary | ICD-10-CM

## 2018-08-26 DIAGNOSIS — R51 Headache: Secondary | ICD-10-CM

## 2018-08-26 DIAGNOSIS — R519 Headache, unspecified: Secondary | ICD-10-CM | POA: Insufficient documentation

## 2018-08-26 MED ORDER — AZITHROMYCIN 250 MG PO TABS: ORAL_TABLET | ORAL | 1 refills | Status: DC

## 2018-08-26 MED ORDER — CYCLOBENZAPRINE HCL 5 MG PO TABS: 5.0000 mg | ORAL_TABLET | Freq: Three times a day (TID) | ORAL | 1 refills | Status: DC | PRN

## 2018-08-26 MED ORDER — NAPROXEN 500 MG PO TABS: 500.0000 mg | ORAL_TABLET | Freq: Two times a day (BID) | ORAL | 2 refills | Status: AC | PRN

## 2018-08-26 NOTE — Patient Instructions (Addendum)
Please take all new medication as prescribed - the antibiotic, as well as the anti-inflammatory medication, and muscle relaxer if needed  Please return if any worsening headache or other neurological problems such as unusual face or extremity weakness or numbness  Please continue all other medications as before, and refills have been done if requested.  Please have the pharmacy call with any other refills you may need.  Please keep your appointments with your specialists as you may have planned  You are given the work note today

## 2018-08-26 NOTE — Progress Notes (Signed)
Subjective:    Patient ID: Michael Valencia, male    DOB: 01/08/1996, 23 y.o.   MRN: [id number]  HPI  Here with c/o 2 wks worsening bilateral intermittent recurring neck pain, mild to mod, worse to turn the head in any direction, radiates to bilat occipital areas, overall getting more frequent and severe in last 2 wks.  Pt denies new neurological symptoms such as new headache, or facial or extremity weakness or numbness  Pt denies chest pain, increased sob or doe, wheezing, orthopnea, PND, increased LE swelling, palpitations, dizziness or syncope.   Pt denies polydipsia, polyuria.  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  Also incidentally,  Here with 2-3 days acute onset fever, facial pain, pressure, headache, general weakness and malaise, and greenish d/c, with mild ST and cough Past Medical History:  Diagnosis Date  . Scoliosis 10/18/2017   No past surgical history on file.  reports that he has been smoking cigarettes. He has been smoking about 0.50 packs per day. He has never used smokeless tobacco. He reports current alcohol use. He reports current drug use. Drug: Marijuana. Family history is unknown by patient. No Known Allergies Current Outpatient Medications on File Prior to Visit  Medication Sig Dispense Refill  . predniSONE (DELTASONE) 10 MG tablet 3 tabs by mouth per day for 3 days,2tabs per day for 3 days,1tab per day for 3 days 18 tablet 0  . traMADol (ULTRAM) 50 MG tablet Take 1 tablet (50 mg total) by mouth every 6 (six) hours as needed. 60 tablet 1   No current facility-administered medications on file prior to visit.    Review of Systems  Constitutional: Negative for other unusual diaphoresis or sweats HENT: Negative for ear discharge or swelling Eyes: Negative for other worsening visual disturbances Respiratory: Negative for stridor or other swelling  Gastrointestinal: Negative for worsening distension or other  blood Genitourinary: Negative for retention or other urinary change Musculoskeletal: Negative for other MSK pain or swelling Skin: Negative for color change or other new lesions Neurological: Negative for worsening tremors and other numbness  Psychiatric/Behavioral: Negative for worsening agitation or other fatigue All other system neg per pt    Objective:   Physical Exam BP 120/76   Pulse 85   Temp 98.5 F (36.9 C) (Oral)   Ht 6\' 1"  (1.854 m)   Wt 217 lb (98.4 kg)   SpO2 97%   BMI 28.63 kg/m  VS noted, mild ill Constitutional: Pt appears in NAD HENT: Head: NCAT.  Right Ear: External ear normal.  Left Ear: External ear normal. Bilat tm's with mild erythema.  Max sinus areas non tender.  Pharynx with mild erythema, no exudate  Eyes: . Pupils are equal, round, and reactive to light. Conjunctivae and EOM are normal Nose: without d/c or deformity Neck: Neck supple. Gross normal ROM, has mild tender to diffuse bilat post neck without rash, swelling or other skin change Cardiovascular: Normal rate and regular rhythm.   Pulmonary/Chest: Effort normal and breath sounds without rales or wheezing.  Neurological: Pt is alert. At baseline orientation, motor grossly intact Skin: Skin is warm. No rashes, other new lesions, no LE edema Psychiatric: Pt behavior is normal without agitation  No other exam findings  Lab Results  Component Value Date   WBC 4.1 07/18/2018   HGB 16.7 07/18/2018   HCT 49.9 07/18/2018   PLT 153 07/18/2018   GLUCOSE 107 (H) 07/18/2018  CHOL 214 (H) 10/18/2017   TRIG 70.0 10/18/2017   HDL 47.90 10/18/2017   LDLCALC 152 (H) 10/18/2017   ALT 36 07/18/2018   AST 21 07/18/2018   NA 138 07/18/2018   K 4.3 07/18/2018   CL 106 07/18/2018   CREATININE 0.86 07/18/2018   BUN 11 07/18/2018   CO2 24 07/18/2018   TSH 1.26 10/18/2017        Assessment & Plan:

## 2018-08-29 NOTE — Assessment & Plan Note (Signed)
Appears related to MSK pain due to work, for nsaid and muscle relaxer prn,  to f/u any worsening symptoms or concerns

## 2018-08-29 NOTE — Assessment & Plan Note (Signed)
bilat occipital, doubt occipital neuralgia, for tx as above

## 2018-08-29 NOTE — Assessment & Plan Note (Signed)
Mild to mod, for antibx course,  to f/u any worsening symptoms or concerns 

## 2018-10-23 ENCOUNTER — Emergency Department (HOSPITAL_COMMUNITY): Payer: BLUE CROSS/BLUE SHIELD

## 2018-10-23 ENCOUNTER — Emergency Department (HOSPITAL_COMMUNITY)
Admission: EM | Admit: 2018-10-23 | Discharge: 2018-10-23 | Disposition: A | Discharge status: Home / Self Care | Payer: BLUE CROSS/BLUE SHIELD | Attending: Emergency Medicine | Admitting: Emergency Medicine

## 2018-10-23 ENCOUNTER — Other Ambulatory Visit: Payer: Self-pay

## 2018-10-23 ENCOUNTER — Encounter (HOSPITAL_COMMUNITY): Payer: Self-pay | Admitting: *Deleted

## 2018-10-23 DIAGNOSIS — W208XXA Other cause of strike by thrown, projected or falling object, initial encounter: Secondary | ICD-10-CM | POA: Diagnosis not present

## 2018-10-23 DIAGNOSIS — Z87891 Personal history of nicotine dependence: Secondary | ICD-10-CM | POA: Insufficient documentation

## 2018-10-23 DIAGNOSIS — F1290 Cannabis use, unspecified, uncomplicated: Secondary | ICD-10-CM | POA: Diagnosis not present

## 2018-10-23 DIAGNOSIS — S60222A Contusion of left hand, initial encounter: Secondary | ICD-10-CM

## 2018-10-23 DIAGNOSIS — M79642 Pain in left hand: Secondary | ICD-10-CM | POA: Diagnosis not present

## 2018-10-23 DIAGNOSIS — Y9389 Activity, other specified: Secondary | ICD-10-CM | POA: Insufficient documentation

## 2018-10-23 DIAGNOSIS — Y929 Unspecified place or not applicable: Secondary | ICD-10-CM | POA: Insufficient documentation

## 2018-10-23 DIAGNOSIS — Y999 Unspecified external cause status: Secondary | ICD-10-CM | POA: Diagnosis not present

## 2018-10-23 DIAGNOSIS — S6992XA Unspecified injury of left wrist, hand and finger(s), initial encounter: Secondary | ICD-10-CM | POA: Diagnosis not present

## 2018-10-23 NOTE — ED Provider Notes (Signed)
[facility] EMERGENCY DEPARTMENT Provider Note   CSN: [id number] Arrival date & time: 10/23/18  1121    History   Chief Complaint Chief Complaint  Patient presents with  . Hand Pain    HPI Michael Valencia is a 23 y.o. male.     23 year old right-hand-dominant male presents with complaint of left hand injury.  Patient states that he was working on his car yesterday when the hood of the car fell on his hand resulting in pain along his thumb and second metacarpal.  Skin is intact swelling in the area, pain worse with movement.  Patient has not taken anything for his pain.  No other injuries, complaints or concerns.     Past Medical History:  Diagnosis Date  . Scoliosis 10/18/2017    Patient Active Problem List   Diagnosis Date Noted  . Headache 08/26/2018  . Bilateral posterior neck pain 08/26/2018  . Acute upper respiratory infection 08/26/2018  . 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 Blood pressure elevated without history of HTN 04/04/2018  . Dysuria 11/27/2017  . STD exposure 11/27/2017  . Scoliosis 10/18/2017  . Encounter for well adult exam with abnormal findings 10/18/2017  . Low back pain 10/18/2017    History reviewed. No pertinent surgical history.      Home Medications    Prior to Admission medications   Medication Sig Start Date End Date Taking? Authorizing Provider  azithromycin (ZITHROMAX Z-PAK) 250 MG tablet 2 tab by mouth day 1, then 1 per day 08/26/18   Corwin Levins, MD  cyclobenzaprine (FLEXERIL) 5 MG tablet Take 1 tablet (5 mg total) by mouth 3 (three) times daily as needed for muscle spasms. 08/26/18   Corwin Levins, MD  naproxen (NAPROSYN) 500 MG tablet Take 1 tablet (500 mg total) by mouth 2 (two) times daily as needed for mild pain or moderate pain. 08/26/18   Corwin Levins, MD  predniSONE (DELTASONE) 10 MG tablet 3 tabs by mouth per day for 3 days,2tabs per day for 3 days,1tab per day for 3 days 04/04/18    Corwin Levins, MD  traMADol (ULTRAM) 50 MG tablet Take 1 tablet (50 mg total) by mouth every 6 (six) hours as needed. 04/04/18   Corwin Levins, MD    Family History Family History  Family history unknown: Yes    Social History Social History   Tobacco Use  . Smoking status: Former Smoker    Packs/day: 0.50    Types: Cigarettes    Last attempt to quit: 08/04/2018    Years since quitting: 0.2  . Smokeless tobacco: Never Used  Substance Use Topics  . Alcohol use: Yes    Comment: occasional  . Drug use: Yes    Types: Marijuana    Comment: daily use     Allergies   Patient has no known allergies.   Review of Systems Review of Systems  Constitutional: Negative for fever.  Musculoskeletal: Positive for arthralgias, joint swelling and myalgias.  Skin: Negative for color change, rash and wound.  Allergic/Immunologic: Negative for immunocompromised state.  Neurological: Negative for weakness and numbness.  Hematological: Does not bruise/bleed easily.  Psychiatric/Behavioral: Negative for self-injury.  All other systems reviewed and are negative.    Physical Exam Updated Vital Signs Ht 6\' 2"  (1.88 m)   Wt 99.8 kg   BMI 28.25 kg/m   Physical Exam Vitals signs and nursing note reviewed.  Constitutional:  General: He is not in acute distress.    Appearance: He is well-developed. He is not diaphoretic.  HENT:     Head: Normocephalic and atraumatic.  Cardiovascular:     Pulses: Normal pulses.  Pulmonary:     Effort: Pulmonary effort is normal.  Musculoskeletal: Normal range of motion.        General: Swelling and tenderness present. No deformity.     Left hand: He exhibits tenderness and swelling. He exhibits normal range of motion, normal capillary refill, no deformity and no laceration. Normal sensation noted. Normal strength noted.       Hands:  Skin:    General: Skin is warm and dry.     Capillary Refill: Capillary refill takes less than 2 seconds.      Findings: No erythema or rash.  Neurological:     Mental Status: He is alert and oriented to person, place, and time.  Psychiatric:        Behavior: Behavior normal.      ED Treatments / Results  Labs (all labs ordered are listed, but only abnormal results are displayed) Labs Reviewed - No data to display  EKG None  Radiology Dg Hand Complete Left  Result Date: 10/23/2018 CLINICAL DATA:  Blunt injury to the left hand with pain. EXAM: LEFT HAND - COMPLETE 3+ VIEW COMPARISON:  None. FINDINGS: There is no evidence of fracture or dislocation. There is no evidence of arthropathy or other focal bone abnormality. Soft tissues are unremarkable. IMPRESSION: Negative. Electronically Signed   By: Ted Mcalpine M.D.   On: 10/23/2018 12:13    Procedures Procedures (including critical care time)  Medications Ordered in ED Medications - No data to display   Initial Impression / Assessment and Plan / ED Course  I have reviewed the triage vital signs and the nursing notes.  Pertinent labs & imaging results that were available during my care of the patient were reviewed by me and considered in my medical decision making (see chart for details).  Clinical Course as of Oct 22 1232  Thu Oct 23, 2018  [id number] 23 year old male with left hand injury after the head of his car closed on his hand yesterday.  X-ray is negative for fracture, there are no open wounds to the hand.  Discussed with patient likely contusion, advised to apply ice, take Motrin Tylenol for pain as needed as directed.  Patient to recheck with PCP in 1 week if not improving.   [LM]    Clinical Course User Index [LM] Jeannie Fend, PA-C   Final Clinical Impressions(s) / ED Diagnoses   Final diagnoses:  Contusion of left hand, initial encounter    ED Discharge Orders    None       Alden Hipp 10/23/18 1234    Tilden Fossa, MD 10/24/18 1046

## 2018-10-23 NOTE — Discharge Instructions (Addendum)
Apply ice to area and elevate for 30 minutes three times daily. Take Motrin and Tylenol as needed as directed for pain.  Recheck with your doctor if pain lasts longer than 1 week.

## 2018-10-23 NOTE — ED Notes (Signed)
Patient verbalizes understanding of discharge instructions. Opportunity for questioning and answers were provided. Armband removed by staff, pt discharged from ED. Pt ambulatory to lobby.  

## 2018-10-23 NOTE — ED Triage Notes (Signed)
Pt in c/o L hand pain onset yesterday after a car hood fell on his hand, small amt of swelling present, able to move some fingers, skin intact, A&O x4

## 2018-10-23 NOTE — ED Notes (Signed)
ED Provider at bedside. 

## 2018-11-03 ENCOUNTER — Encounter: Payer: Self-pay | Admitting: Internal Medicine

## 2018-11-03 ENCOUNTER — Ambulatory Visit: Payer: BLUE CROSS/BLUE SHIELD | Admitting: Internal Medicine

## 2018-11-03 ENCOUNTER — Other Ambulatory Visit: Payer: Self-pay

## 2018-11-03 VITALS — BP 124/82 | HR 82 | Temp 97.9°F | Ht 74.0 in | Wt 211.0 lb

## 2018-11-03 DIAGNOSIS — J069 Acute upper respiratory infection, unspecified: Secondary | ICD-10-CM

## 2018-11-03 NOTE — Assessment & Plan Note (Signed)
Symptoms just started last night Likely viral in nature, but without fever will not test for flu or COVID-19 Symptomatic treatment discussed Rest, fluids Note given for him to stay out of work for the next couple of days Call if symptoms change or do not improve

## 2018-11-03 NOTE — Progress Notes (Signed)
Subjective:    Patient ID: Michael Valencia, male    DOB: [birth year], 23 y.o.   MRN: [id number]  HPI He is here for an acute visit for cold symptoms.  His symptoms started last night  He is experiencing chills, mild nasal congestion, runny nose, sore throat, dry cough, diarrhea last night, body aches and headaches.  He denies any fever, ear pain, sinus pain, shortness of breath, wheezing, nausea, lightheadedness/dizziness.  His sister is also sick.  He has tried taking Tylenol   Medications and allergies reviewed with patient and updated if appropriate.  Patient Active Problem List   Diagnosis Date Noted  . Headache 08/26/2018  . Bilateral posterior neck pain 08/26/2018  . Acute upper respiratory infection 08/26/2018  . 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 Blood pressure elevated without history of HTN 04/04/2018  . Dysuria 11/27/2017  . STD exposure 11/27/2017  . Scoliosis 10/18/2017  . Encounter for well adult exam with abnormal findings 10/18/2017  . Low back pain 10/18/2017    Current Outpatient Medications on File Prior to Visit  Medication Sig Dispense Refill  . azithromycin (ZITHROMAX Z-PAK) 250 MG tablet 2 tab by mouth day 1, then 1 per day 6 tablet 1  . cyclobenzaprine (FLEXERIL) 5 MG tablet Take 1 tablet (5 mg total) by mouth 3 (three) times daily as needed for muscle spasms. 40 tablet 1  . naproxen (NAPROSYN) 500 MG tablet Take 1 tablet (500 mg total) by mouth 2 (two) times daily as needed for mild pain or moderate pain. 60 tablet 2  . predniSONE (DELTASONE) 10 MG tablet 3 tabs by mouth per day for 3 days,2tabs per day for 3 days,1tab per day for 3 days 18 tablet 0  . traMADol (ULTRAM) 50 MG tablet Take 1 tablet (50 mg total) by mouth every 6 (six) hours as needed. 60 tablet 1   No current facility-administered medications on file prior to visit.     Past Medical History:  Diagnosis Date  . Scoliosis 10/18/2017    No past surgical  history on file.  Social History   Socioeconomic History  . Marital status: Single    Spouse name: Not on file  . Number of children: Not on file  . Years of education: Not on file  . Highest education level: Not on file  Occupational History  . Not on file  Social Needs  . Financial resource strain: Not on file  . Food insecurity:    Worry: Not on file    Inability: Not on file  . Transportation needs:    Medical: Not on file    Non-medical: Not on file  Tobacco Use  . Smoking status: Former Smoker    Packs/day: 0.50    Types: Cigarettes    Last attempt to quit: 08/04/2018    Years since quitting: 0.2  . Smokeless tobacco: Never Used  Substance and Sexual Activity  . Alcohol use: Yes    Comment: occasional  . Drug use: Yes    Types: Marijuana    Comment: daily use  . Sexual activity: Not on file  Lifestyle  . Physical activity:    Days per week: Not on file    Minutes per session: Not on file  . Stress: Not on file  Relationships  . Social connections:    Talks on phone: Not on file    Gets together: Not on file    Attends religious service: Not on file  Active member of club or organization: Not on file    Attends meetings of clubs or organizations: Not on file    Relationship status: Not on file  Other Topics Concern  . Not on file  Social History Narrative  . Not on file    Family History  Family history unknown: Yes    Review of Systems  Constitutional: Positive for chills. Negative for fever.  HENT: Positive for congestion (mild), rhinorrhea and sore throat. Negative for ear pain and sinus pain.   Respiratory: Positive for cough. Negative for shortness of breath and wheezing.   Gastrointestinal: Positive for diarrhea (mild last night). Negative for nausea.  Musculoskeletal: Positive for myalgias.  Neurological: Positive for headaches. Negative for dizziness and light-headedness.       Objective:   Vitals:   11/03/18 1025  BP: 124/82   Pulse: 82  Temp: 97.9 F (36.6 C)  SpO2: 98%   Filed Weights   11/03/18 1025  Weight: 211 lb (95.7 kg)   Body mass index is 27.09 kg/m.  Wt Readings from Last 3 Encounters:  11/03/18 211 lb (95.7 kg)  10/23/18 220 lb (99.8 kg)  08/26/18 217 lb (98.4 kg)     Physical Exam GENERAL APPEARANCE: Appears stated age, well appearing, NAD EYES: conjunctiva clear, no icterus HEENT: bilateral tympanic membranes and ear canals normal, oropharynx with mild erythema, no thyromegaly, trachea midline, no cervical or supraclavicular lymphadenopathy LUNGS: Clear to auscultation without wheeze or crackles, unlabored breathing, good air entry bilaterally CARDIOVASCULAR: Normal S1,S2 without murmurs, no edema SKIN: warm, dry        Assessment & Plan:   See Problem List for Assessment and Plan of chronic medical problems.

## 2018-11-03 NOTE — Patient Instructions (Addendum)
  If your symptoms worsen or fail to improve, please contact our office for further instruction, or in case of emergency go directly to the emergency room at the closest medical facility.   General Recommendations:    Please drink plenty of fluids.  Get plenty of rest   Sleep in humidified air  Use saline nasal sprays  Netti pot  OTC Medications:  Decongestants - helps relieve congestion   Flonase (generic fluticasone) or Nasacort (generic triamcinolone) - please make sure to use the "cross-over" technique at a 45 degree angle towards the opposite eye as opposed to straight up the nasal passageway.   Sudafed (generic pseudoephedrine - Note this is the one that is available behind the pharmacy counter); Products with phenylephrine (-PE) may also be used but is often not as effective as pseudoephedrine.   If you have HIGH BLOOD PRESSURE - Coricidin HBP; AVOID any product that is -D as this contains pseudoephedrine which may increase your blood pressure.  Afrin (oxymetazoline) every 6-8 hours for up to 3 days.  Allergies - helps relieve runny nose, itchy eyes and sneezing   Claritin (generic loratidine), Allegra (fexofenidine), or Zyrtec (generic cyrterizine) for runny nose. These medications should not cause drowsiness.  Note - Benadryl (generic diphenhydramine) may be used however may cause drowsiness  Cough -   Delsym or Robitussin (generic dextromethorphan)  Expectorants - helps loosen mucus to ease removal   Mucinex (generic guaifenesin) as directed on the package.  Headaches / General Aches   Tylenol (generic acetaminophen) - DO NOT EXCEED 3 grams (3,000 mg) in a 24 hour time period  Advil/Motrin (generic ibuprofen)  Sore Throat -   Salt water gargle   Chloraseptic (generic benzocaine) spray or lozenges / Sucrets (generic dyclonine)  

## 2019-03-26 ENCOUNTER — Ambulatory Visit (INDEPENDENT_AMBULATORY_CARE_PROVIDER_SITE_OTHER): Payer: BLUE CROSS/BLUE SHIELD | Admitting: Internal Medicine

## 2019-03-26 ENCOUNTER — Encounter: Payer: Self-pay | Admitting: Internal Medicine

## 2019-03-26 ENCOUNTER — Other Ambulatory Visit: Payer: Self-pay

## 2019-03-26 VITALS — BP 122/80 | HR 86 | Temp 98.0°F | Ht 74.0 in | Wt 210.0 lb

## 2019-03-26 DIAGNOSIS — M5416 Radiculopathy, lumbar region: Secondary | ICD-10-CM

## 2019-03-26 DIAGNOSIS — M419 Scoliosis, unspecified: Secondary | ICD-10-CM | POA: Diagnosis not present

## 2019-03-26 DIAGNOSIS — R030 Elevated blood-pressure reading, without diagnosis of hypertension: Secondary | ICD-10-CM | POA: Diagnosis not present

## 2019-03-26 MED ORDER — CYCLOBENZAPRINE HCL 5 MG PO TABS: 5.0000 mg | ORAL_TABLET | Freq: Three times a day (TID) | ORAL | 1 refills | Status: AC | PRN

## 2019-03-26 MED ORDER — TRAMADOL HCL 50 MG PO TABS: 50.0000 mg | ORAL_TABLET | Freq: Four times a day (QID) | ORAL | 1 refills | Status: AC | PRN

## 2019-03-26 NOTE — Patient Instructions (Signed)
Please take all new medication as prescribed - the muscle relaxer and pain medication  Please continue all other medications as before, and refills have been done if requested.  Please have the pharmacy call with any other refills you may need.  Please keep your appointments with your specialists as you may have planned  You will be contacted regarding the referral for: MRI for the lower back

## 2019-03-26 NOTE — Progress Notes (Signed)
Subjective:    Patient ID: Michael Valencia, male    DOB: Apr 13, 1996, 23 y.o.   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  Better to not do this  Pt denies chest pain, increased sob or doe, wheezing, orthopnea, PND, increased LE swelling, palpitations, dizziness or syncope.  . Pt denies polydipsia, polyuria Past Medical History:  Diagnosis Date  . Scoliosis 10/18/2017   No past surgical history on file.  reports that he quit smoking about 7 months ago. His smoking use included cigarettes. He smoked 0.50 packs per day. He has never used smokeless tobacco. He reports current alcohol use. He reports current drug use. Drug: Marijuana. Family history is unknown by patient. No Known Allergies Current Outpatient Medications on File Prior to Visit  Medication Sig Dispense Refill  . naproxen (NAPROSYN) 500 MG tablet Take 1 tablet (500 mg total) by mouth 2 (two) times daily as needed for mild pain or moderate pain. 60 tablet 2   No current facility-administered medications on file prior to visit.    Review of Systems  Constitutional: Negative for other unusual diaphoresis or sweats HENT: Negative for ear discharge or swelling Eyes: Negative for other worsening visual disturbances Respiratory: Negative for stridor or other swelling  Gastrointestinal: Negative for worsening distension or other blood Genitourinary: Negative for retention or other urinary change Musculoskeletal: Negative for other MSK pain or swelling Skin: Negative for color change or other new lesions Neurological: Negative for worsening tremors and other numbness  Psychiatric/Behavioral: Negative for worsening  agitation or other fatigue All other system neg per pt    Objective:   Physical Exam BP 122/80   Pulse 86   Temp 98 F (36.7 C) (Oral)   Ht 6\' 2"  (1.88 m)   Wt 210 lb (95.3 kg)   SpO2 97%   BMI 26.96 kg/m  VS noted, not ill appearing Constitutional: Pt appears in NAD HENT: Head: NCAT.  Right Ear: External ear normal.  Left Ear: External ear normal.  Eyes: . Pupils are equal, round, and reactive to light. Conjunctivae and EOM are normal Nose: without d/c or deformity Neck: Neck supple. Gross normal ROM Cardiovascular: Normal rate and regular rhythm.   Pulmonary/Chest: Effort normal and breath sounds without rales or wheezing.  Spine mild tender lowest midline and right paravertebral tender without rash or swelling Neurological: Pt is alert. At baseline orientation, motor grossly intact, cn 2-12 intact Skin: Skin is warm. No rashes, other new lesions, no LE edema Psychiatric: Pt behavior is normal without agitation  No other exam findings Lab Results  Component Value Date   WBC 4.1 07/18/2018   HGB 16.7 07/18/2018   HCT 49.9 07/18/2018   PLT 153 07/18/2018   GLUCOSE 107 (H) 07/18/2018   CHOL 214 (H) 10/18/2017   TRIG 70.0 10/18/2017   HDL 47.90 10/18/2017   LDLCALC 152 (H) 10/18/2017   ALT 36 07/18/2018   AST 21 07/18/2018   NA 138 07/18/2018   K 4.3 07/18/2018   CL 106 07/18/2018   CREATININE 0.86 07/18/2018   BUN 11 07/18/2018   CO2 24 07/18/2018   TSH  1.26 10/18/2017          Assessment & Plan:

## 2019-03-29 ENCOUNTER — Encounter: Payer: Self-pay | Admitting: Internal Medicine

## 2019-03-29 NOTE — Assessment & Plan Note (Signed)
Also for f/u MRI, consider ortho referral

## 2019-03-29 NOTE — Assessment & Plan Note (Signed)
For MRI ls spine, trial muscle relaxer prn, and tramadol prn,  to f/u any worsening symptoms or concerns

## 2019-03-29 NOTE — Assessment & Plan Note (Signed)
stable overall by history and exam, recent data reviewed with pt, and pt to continue medical treatment as before,  to f/u any worsening symptoms or concerns  

## 2019-04-08 ENCOUNTER — Encounter: Payer: Self-pay | Admitting: Internal Medicine

## 2019-07-21 DIAGNOSIS — Z03818 Encounter for observation for suspected exposure to other biological agents ruled out: Secondary | ICD-10-CM | POA: Diagnosis not present

## 2019-08-09 DIAGNOSIS — Z20818 Contact with and (suspected) exposure to other bacterial communicable diseases: Secondary | ICD-10-CM | POA: Diagnosis not present

## 2019-08-10 DIAGNOSIS — Z20818 Contact with and (suspected) exposure to other bacterial communicable diseases: Secondary | ICD-10-CM | POA: Diagnosis not present

## 2020-01-02 IMAGING — DX DG HAND COMPLETE 3+V*L*
3 series · 3 of 3 positions shown · non-contrast
Comparison: None.

CLINICAL DATA: Blunt injury to the left hand with pain.

EXAM:
LEFT HAND - COMPLETE 3+ VIEW

[hand ap]
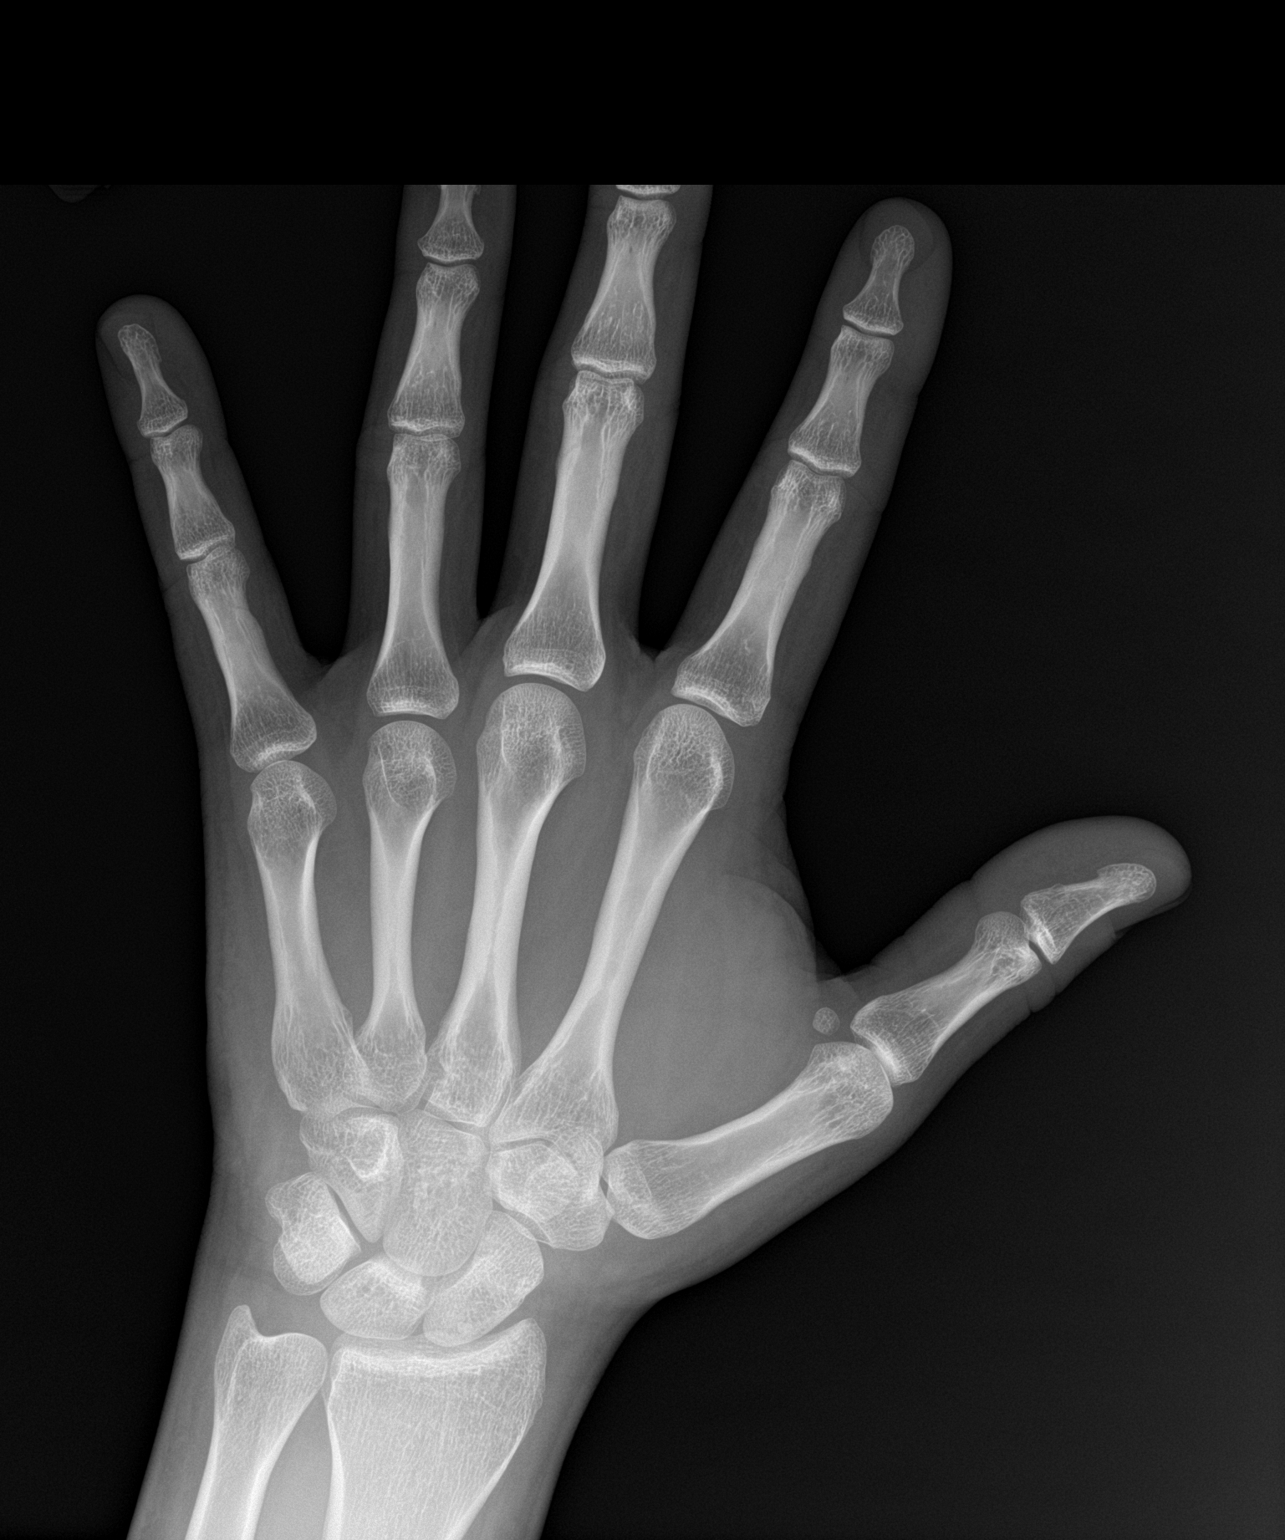

[hand obl]
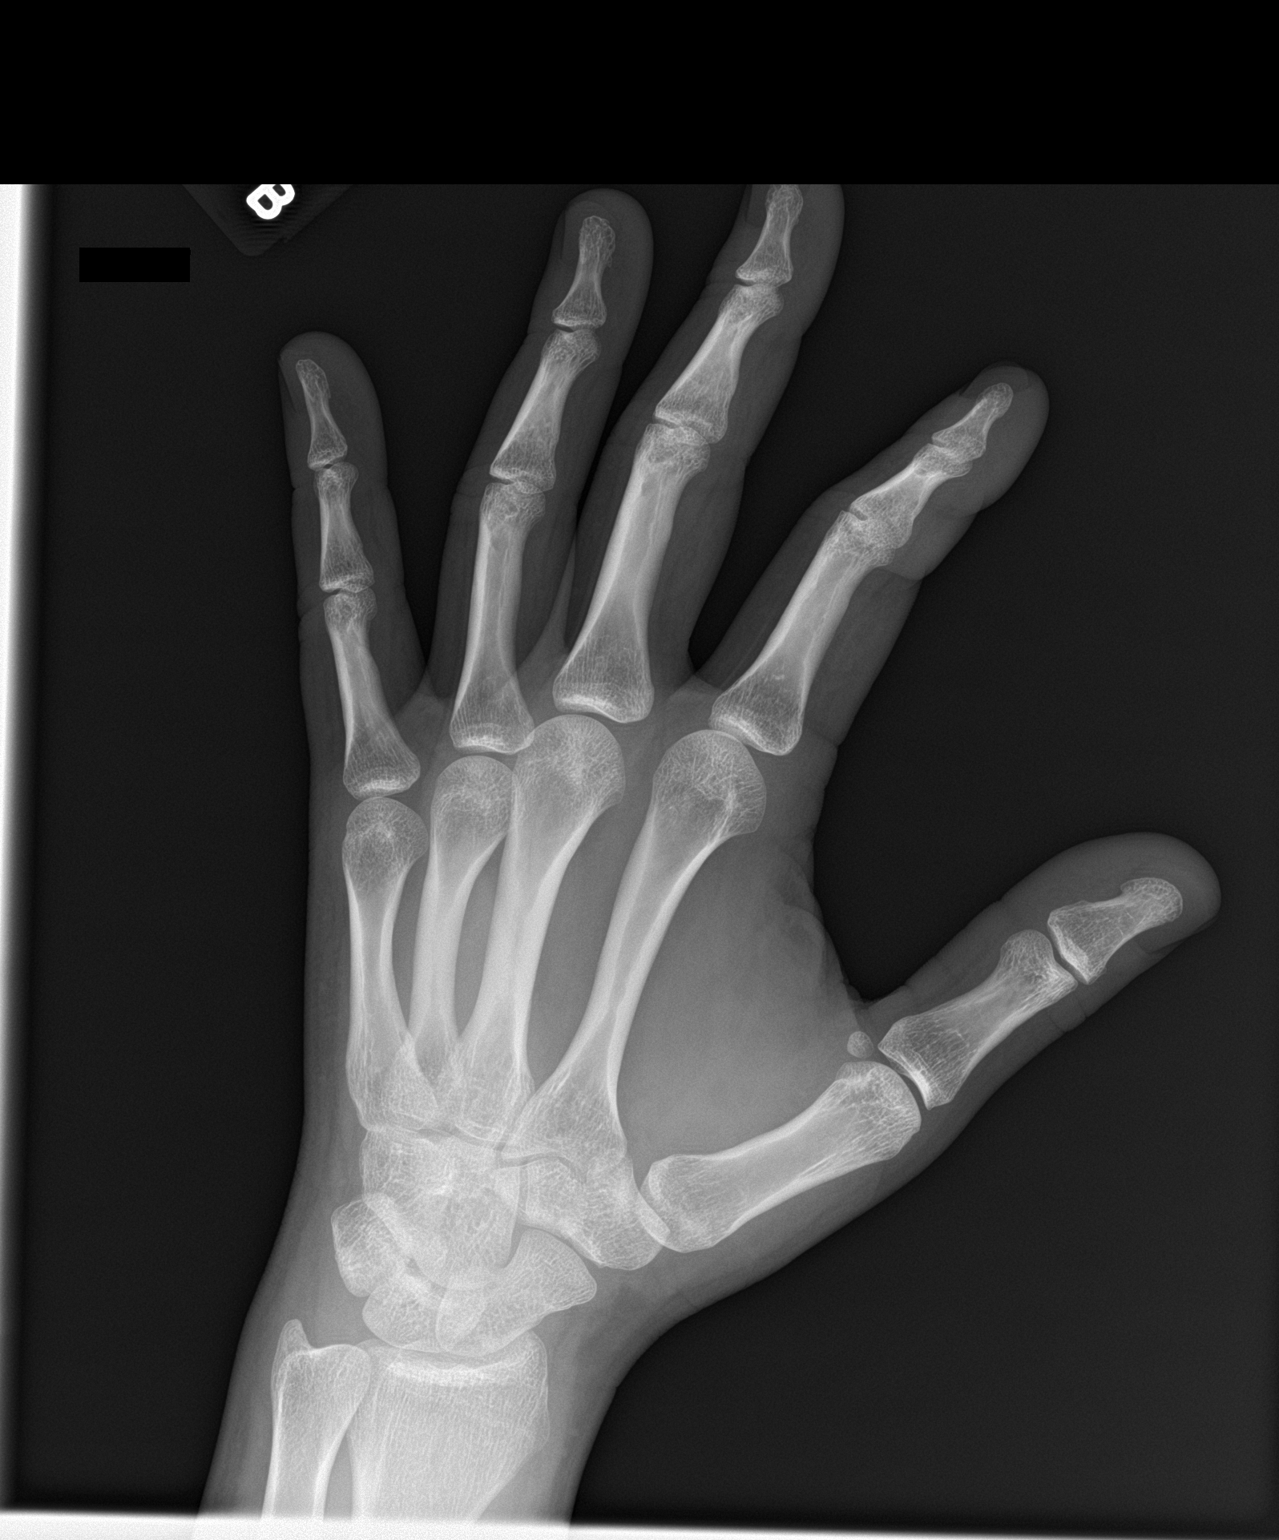

[hand lat]
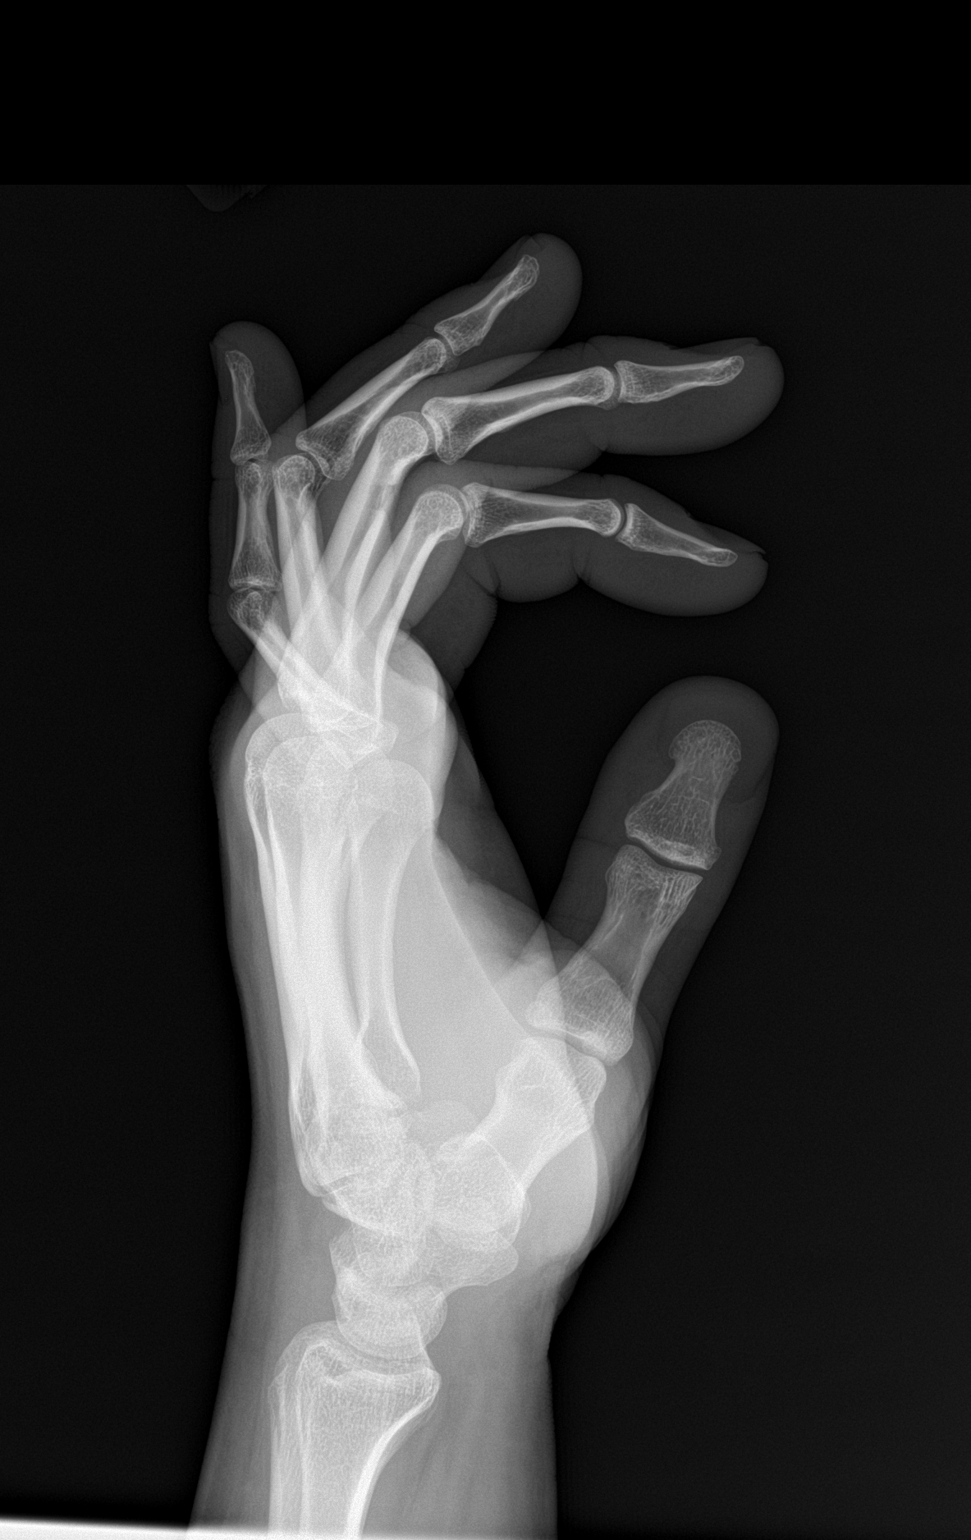

[3 of 3 positions shown; findings below may reference images not displayed]

FINDINGS: There is no evidence of fracture or dislocation. There is no
evidence of arthropathy or other focal bone abnormality. Soft
tissues are unremarkable.
IMPRESSION: Negative.

## 2020-03-14 ENCOUNTER — Ambulatory Visit: Payer: BC Managed Care – PPO | Attending: Internal Medicine

## 2020-03-14 DIAGNOSIS — Z20822 Contact with and (suspected) exposure to covid-19: Secondary | ICD-10-CM | POA: Diagnosis not present

## 2020-03-15 ENCOUNTER — Telehealth: Payer: Self-pay | Admitting: *Deleted

## 2020-03-15 LAB — SARS-COV-2, NAA 2 DAY TAT

## 2020-03-15 LAB — NOVEL CORONAVIRUS, NAA: SARS-CoV-2, NAA: NOT DETECTED

## 2020-03-15 NOTE — Telephone Encounter (Signed)
Patient calling for test results- notified negative- discussed quarentine for exposure in the home.

## 2022-05-28 ENCOUNTER — Encounter (HOSPITAL_COMMUNITY): Payer: Self-pay | Admitting: Emergency Medicine

## 2022-05-28 ENCOUNTER — Emergency Department (HOSPITAL_COMMUNITY)
Admission: EM | Admit: 2022-05-28 | Discharge: 2022-05-28 | Discharge status: Against Medical Advice | Payer: Managed Care, Other (non HMO) | Attending: Emergency Medicine | Admitting: Emergency Medicine

## 2022-05-28 ENCOUNTER — Other Ambulatory Visit: Payer: Self-pay

## 2022-05-28 DIAGNOSIS — Z5321 Procedure and treatment not carried out due to patient leaving prior to being seen by health care provider: Secondary | ICD-10-CM | POA: Diagnosis not present

## 2022-05-28 DIAGNOSIS — M545 Low back pain, unspecified: Secondary | ICD-10-CM | POA: Insufficient documentation

## 2022-05-28 NOTE — ED Notes (Signed)
Pt called x 2 and staff attempted to locate pt outside prior to d/c pt out as [facility]

## 2022-05-28 NOTE — ED Triage Notes (Signed)
Pt presents with right lower back pain x1 week, denies injury or urinary symptoms, PMH of Scoliosis.

## 2024-03-03 ENCOUNTER — Emergency Department (HOSPITAL_BASED_OUTPATIENT_CLINIC_OR_DEPARTMENT_OTHER)
Admission: EM | Admit: 2024-03-03 | Discharge: 2024-03-03 | Disposition: A | Discharge status: Home / Self Care | Attending: Emergency Medicine | Admitting: Emergency Medicine

## 2024-03-03 ENCOUNTER — Other Ambulatory Visit (HOSPITAL_BASED_OUTPATIENT_CLINIC_OR_DEPARTMENT_OTHER): Payer: Self-pay

## 2024-03-03 ENCOUNTER — Other Ambulatory Visit: Payer: Self-pay

## 2024-03-03 DIAGNOSIS — L03116 Cellulitis of left lower limb: Secondary | ICD-10-CM | POA: Insufficient documentation

## 2024-03-03 DIAGNOSIS — Z23 Encounter for immunization: Secondary | ICD-10-CM | POA: Insufficient documentation

## 2024-03-03 DIAGNOSIS — L0390 Cellulitis, unspecified: Secondary | ICD-10-CM

## 2024-03-03 MED ORDER — DOXYCYCLINE HYCLATE 100 MG PO CAPS
100.0000 mg | ORAL_CAPSULE | Freq: Two times a day (BID) | ORAL | 0 refills | Status: AC
  Filled 2024-03-03: qty 14, 7d supply, fill #0

## 2024-03-03 MED ORDER — CEPHALEXIN 500 MG PO CAPS
500.0000 mg | ORAL_CAPSULE | Freq: Four times a day (QID) | ORAL | 0 refills | Status: AC
  Filled 2024-03-03: qty 28, 7d supply, fill #0

## 2024-03-03 MED ORDER — TETANUS-DIPHTHERIA TOXOIDS TD 5-2 LFU IM INJ
0.5000 mL | INJECTION | Freq: Once | INTRAMUSCULAR | Status: AC
  Administered 2024-03-03: 0.5 mL via INTRAMUSCULAR
  Filled 2024-03-03: qty 0.5

## 2024-03-03 NOTE — Discharge Instructions (Addendum)
 Stop taking the Bactrim.  Go to your local hospital if you develop redness outside of the marked area on your left leg

## 2024-03-03 NOTE — ED Triage Notes (Signed)
 Pt caox4, ambulatory c/o possible spider bite, first noticed Friday, noticed it started spreading yesterday appearing darker in the center, has been on abx since Sat.

## 2024-03-03 NOTE — ED Provider Notes (Signed)
 [place] EMERGENCY DEPARTMENT AT [facility] Provider Note   CSN: [id number] Arrival date & time: 03/03/24  1016     Patient presents with: Insect Bite   Michael Valencia is a 28 y.o. male.   28 year old male presents with cellulitis to his left proximal anterior tibia.  Seen 2 days ago in outside facility and started on Bactrim.  States that the redness has not improved.  Denies any fever or chills.  No streaks going up his leg.  Denies any purulent drainage.       Prior to Admission medications   Medication Sig Start Date End Date Taking? Authorizing Provider  cyclobenzaprine  (FLEXERIL ) 5 MG tablet Take 1 tablet (5 mg total) by mouth 3 (three) times daily as needed for muscle spasms. 03/26/19   Norleen Lynwood ORN, MD  naproxen  (NAPROSYN ) 500 MG tablet Take 1 tablet (500 mg total) by mouth 2 (two) times daily as needed for mild pain or moderate pain. 08/26/18   Norleen Lynwood ORN, MD  traMADol  (ULTRAM ) 50 MG tablet Take 1 tablet (50 mg total) by mouth every 6 (six) hours as needed. 03/26/19   Norleen Lynwood ORN, MD    Allergies: Patient has no known allergies.    Review of Systems  All other systems reviewed and are negative.   Updated Vital Signs BP 138/89 (BP Location: Right Arm)   Pulse 91   Temp 98.5 F (36.9 C)   Resp 14   SpO2 99%   Physical Exam Vitals and nursing note reviewed.  Constitutional:      General: He is not in acute distress.    Appearance: Normal appearance. He is well-developed. He is not toxic-appearing.  HENT:     Head: Normocephalic and atraumatic.  Eyes:     General: Lids are normal.     Conjunctiva/sclera: Conjunctivae normal.     Pupils: Pupils are equal, round, and reactive to light.  Neck:     Thyroid : No thyroid  mass.     Trachea: No tracheal deviation.  Cardiovascular:     Rate and Rhythm: Normal rate and regular rhythm.     Heart sounds: Normal heart sounds. No murmur heard.    No gallop.  Pulmonary:     Effort: Pulmonary effort is  normal. No respiratory distress.     Breath sounds: Normal breath sounds. No stridor. No decreased breath sounds, wheezing, rhonchi or rales.  Abdominal:     General: There is no distension.     Palpations: Abdomen is soft.     Tenderness: There is no abdominal tenderness. There is no rebound.  Musculoskeletal:        General: No tenderness. Normal range of motion.     Cervical back: Normal range of motion and neck supple.       Legs:  Skin:    General: Skin is warm and dry.     Findings: No abrasion or rash.  Neurological:     Mental Status: He is alert and oriented to person, place, and time. Mental status is at baseline.     GCS: GCS eye subscore is 4. GCS verbal subscore is 5. GCS motor subscore is 6.     Cranial Nerves: No cranial nerve deficit.     Sensory: No sensory deficit.     Motor: Motor function is intact.  Psychiatric:        Attention and Perception: Attention normal.        Speech: Speech normal.  Behavior: Behavior normal.     (all labs ordered are listed, but only abnormal results are displayed) Labs Reviewed - No data to display  EKG: None  Radiology: No results found.   Procedures   Medications Ordered in the ED - No data to display                                  Medical Decision Making  Patient's tetanus updated here.  Will have patient DC the Bactrim and add Keflex  and doxycycline .  Return precautions given     Final diagnoses:  None    ED Discharge Orders     None          Dasie Faden, MD 03/03/24 1052
